# Patient Record
Sex: Female | Born: 1985 | Race: White | Hispanic: No | Marital: Single | Smoking: Never smoker
Health system: Southern US, Community
[De-identification: ages and names within clinical notes are randomized; demographics above are authoritative.]

## PROBLEM LIST (undated history)

## (undated) DIAGNOSIS — L309 Dermatitis, unspecified: Secondary | ICD-10-CM

## (undated) DIAGNOSIS — B999 Unspecified infectious disease: Secondary | ICD-10-CM

## (undated) DIAGNOSIS — O24419 Gestational diabetes mellitus in pregnancy, unspecified control: Secondary | ICD-10-CM

## (undated) DIAGNOSIS — N390 Urinary tract infection, site not specified: Secondary | ICD-10-CM

## (undated) DIAGNOSIS — N200 Calculus of kidney: Secondary | ICD-10-CM

## (undated) HISTORY — PX: INDUCED ABORTION: SHX677

---
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
## 2006-11-27 ENCOUNTER — Other Ambulatory Visit: Admission: RE | Admit: 2006-11-27 | Discharge: 2006-11-27 | Payer: Self-pay | Admitting: Family Medicine

## 2010-03-27 ENCOUNTER — Inpatient Hospital Stay (HOSPITAL_COMMUNITY): Admission: AD | Admit: 2010-03-27 | Discharge: 2010-03-28 | Payer: Self-pay | Admitting: Obstetrics & Gynecology

## 2010-10-11 ENCOUNTER — Emergency Department (HOSPITAL_COMMUNITY): Admission: EM | Admit: 2010-10-11 | Discharge: 2010-05-26 | Payer: Self-pay | Admitting: Emergency Medicine

## 2010-11-07 ENCOUNTER — Inpatient Hospital Stay (HOSPITAL_COMMUNITY)
Admission: AD | Admit: 2010-11-07 | Discharge: 2010-11-10 | Payer: Self-pay | Source: Home / Self Care | Attending: Obstetrics and Gynecology | Admitting: Obstetrics and Gynecology

## 2010-11-07 ENCOUNTER — Encounter (INDEPENDENT_AMBULATORY_CARE_PROVIDER_SITE_OTHER): Payer: Self-pay | Admitting: Obstetrics and Gynecology

## 2010-11-07 LAB — CBC
HCT: 31.4 % — ABNORMAL LOW (ref 36.0–46.0)
Hemoglobin: 11 g/dL — ABNORMAL LOW (ref 12.0–15.0)
MCH: 29.4 pg (ref 26.0–34.0)
MCHC: 35 g/dL (ref 30.0–36.0)
MCV: 84 fL (ref 78.0–100.0)
Platelets: 214 10*3/uL (ref 150–400)
RBC: 3.74 MIL/uL — ABNORMAL LOW (ref 3.87–5.11)
RDW: 13.4 % (ref 11.5–15.5)
WBC: 9.2 10*3/uL (ref 4.0–10.5)

## 2010-11-07 LAB — RPR: RPR Ser Ql: NONREACTIVE

## 2010-11-08 LAB — CBC
HCT: 29 % — ABNORMAL LOW (ref 36.0–46.0)
HCT: 27.8 % — ABNORMAL LOW (ref 36.0–46.0)
Hemoglobin: 10.1 g/dL — ABNORMAL LOW (ref 12.0–15.0)
Hemoglobin: 9.6 g/dL — ABNORMAL LOW (ref 12.0–15.0)
MCH: 29.3 pg (ref 26.0–34.0)
MCH: 29.3 pg (ref 26.0–34.0)
MCHC: 34.8 g/dL (ref 30.0–36.0)
MCHC: 34.5 g/dL (ref 30.0–36.0)
MCV: 84.1 fL (ref 78.0–100.0)
MCV: 84.8 fL (ref 78.0–100.0)
Platelets: 172 10*3/uL (ref 150–400)
Platelets: 193 10*3/uL (ref 150–400)
RBC: 3.45 MIL/uL — ABNORMAL LOW (ref 3.87–5.11)
RBC: 3.28 MIL/uL — ABNORMAL LOW (ref 3.87–5.11)
RDW: 13.4 % (ref 11.5–15.5)
RDW: 13.4 % (ref 11.5–15.5)
WBC: 12.7 10*3/uL — ABNORMAL HIGH (ref 4.0–10.5)
WBC: 13.6 10*3/uL — ABNORMAL HIGH (ref 4.0–10.5)

## 2010-11-12 ENCOUNTER — Inpatient Hospital Stay (HOSPITAL_COMMUNITY)
Admission: AD | Admit: 2010-11-12 | Discharge: 2010-11-13 | Payer: Self-pay | Source: Home / Self Care | Attending: Obstetrics and Gynecology | Admitting: Obstetrics and Gynecology

## 2010-11-19 LAB — DIFFERENTIAL
Basophils Absolute: 0 10*3/uL (ref 0.0–0.1)
Basophils Relative: 0 % (ref 0–1)
Eosinophils Absolute: 0.1 10*3/uL (ref 0.0–0.7)
Eosinophils Relative: 1 % (ref 0–5)
Lymphocytes Relative: 16 % (ref 12–46)
Lymphs Abs: 1.5 10*3/uL (ref 0.7–4.0)
Monocytes Absolute: 0.8 10*3/uL (ref 0.1–1.0)
Monocytes Relative: 8 % (ref 3–12)
Neutro Abs: 7.4 10*3/uL (ref 1.7–7.7)
Neutrophils Relative %: 75 % (ref 43–77)

## 2010-11-19 LAB — CBC
HCT: 27.5 % — ABNORMAL LOW (ref 36.0–46.0)
Hemoglobin: 9.4 g/dL — ABNORMAL LOW (ref 12.0–15.0)
MCH: 29.1 pg (ref 26.0–34.0)
MCHC: 34.2 g/dL (ref 30.0–36.0)
MCV: 85.1 fL (ref 78.0–100.0)
Platelets: 263 10*3/uL (ref 150–400)
RBC: 3.23 MIL/uL — ABNORMAL LOW (ref 3.87–5.11)
RDW: 12.9 % (ref 11.5–15.5)
WBC: 9.9 10*3/uL (ref 4.0–10.5)

## 2010-11-19 LAB — URINALYSIS, ROUTINE W REFLEX MICROSCOPIC
Bilirubin Urine: NEGATIVE
Ketones, ur: NEGATIVE mg/dL
Nitrite: NEGATIVE
Protein, ur: NEGATIVE mg/dL
Specific Gravity, Urine: 1.01 (ref 1.005–1.030)
Urine Glucose, Fasting: NEGATIVE mg/dL
Urobilinogen, UA: 2 mg/dL — ABNORMAL HIGH (ref 0.0–1.0)
pH: 7 (ref 5.0–8.0)

## 2010-11-19 LAB — COMPREHENSIVE METABOLIC PANEL
ALT: 16 U/L (ref 0–35)
AST: 15 U/L (ref 0–37)
Albumin: 2.5 g/dL — ABNORMAL LOW (ref 3.5–5.2)
Alkaline Phosphatase: 117 U/L (ref 39–117)
BUN: 6 mg/dL (ref 6–23)
CO2: 27 mEq/L (ref 19–32)
Calcium: 8.2 mg/dL — ABNORMAL LOW (ref 8.4–10.5)
Chloride: 108 mEq/L (ref 96–112)
Creatinine, Ser: 0.63 mg/dL (ref 0.4–1.2)
GFR calc Af Amer: >60 mL/min (ref >60)
GFR calc non Af Amer: >60 mL/min (ref >60)
Glucose, Bld: 80 mg/dL (ref 70–99)
Potassium: 3.1 mEq/L — ABNORMAL LOW (ref 3.5–5.1)
Sodium: 140 mEq/L (ref 135–145)
Total Bilirubin: 0.7 mg/dL (ref 0.3–1.2)
Total Protein: 5.3 g/dL — ABNORMAL LOW (ref 6.0–8.3)

## 2010-11-19 LAB — URINE MICROSCOPIC-ADD ON

## 2010-11-22 ENCOUNTER — Ambulatory Visit: Admit: 2010-11-22 | Payer: Self-pay | Admitting: Pulmonary Disease

## 2010-11-26 NOTE — Op Note (Signed)
Theresa Shepherd, Theresa Shepherd                ACCOUNT NO.:  [id number]  MEDICAL RECORD NO.:  [id number]          PATIENT TYPE:  INP  LOCATION:  9144                          FACILITY:  [facility]  PHYSICIAN:  Naima A. Dillard, M.D. DATE OF BIRTH:  [birth year]  DATE OF PROCEDURE: DATE OF DISCHARGE:                              OPERATIVE REPORT   PREOPERATIVE DIAGNOSIS:  Term, failure to progress, and nonreassuring fetal heart tones.  POSTOPERATIVE DIAGNOSIS:  Term, failure to progress, and nonreassuring fetal heart tones.  PROCEDURE:  Primary low transverse cesarean section.  SURGEON:  Naima A. Normand Sloop, M.D.  ASSISTANT:  Renaldo Reel. Emilee Hero, C.N.M.  ANESTHESIA:  Epidural.  FINDINGS:  Female infant with the body cord in vertex presentation, born at 11:35 p.m. with Apgars of 5 and 9.  Both arterial and venous gases were greater than 7.2.  Placenta was sent to Pathology.  ESTIMATED BLOOD LOSS:  800 mL.  IV FLUIDS:  3000 mL.  Urine was clear at the end of procedure.  There were no complications.  The patient to the PACU in stable condition.  PROCEDURE IN DETAIL:  The patient was taken to the operating room where epidural anesthesia was found to be adequate.  She was placed in dorsal supine position with a left lateral tilt.  A Pfannenstiel skin incision was made with a scalpel and carried down to the fascia.  The fascia was incised in the midline, extended bilaterally using Mayo scissors. Kochers x2 placed in the superior aspect of the fascia which was dissected off the rectus muscle in a blunt fashion and with scissors. The inferior aspect of the fascia was dissected in a similar fashion. The muscles were separated in the midline.  Peritoneum was identified, tented up, and entered sharply.  Bladder blade was reinserted.  The vesicouterine peritoneum was identified, tented up, and then sharply extended bilaterally.  The bladder blade was reinserted.  A primary low transverse uterine incision was  made with the scalpel and extended transversely bluntly.  Infant's head was delivered without difficulty, noted to have body cord with clear fluid.  The body was delivered without difficulty.  Cord clamped and cut.  Infant was handed to the awaiting pediatricians.  Cord gases were obtained.  Placenta was manually delivered.  The uterus was cleared of all clot and debris. Uterine incision was repaired with 0 Vicryl in a running locked fashion. Second layer of 0 Vicryl was used to imbricate the uterus.  Irrigation was done.  Hemostasis was assured.  Both adnexa were normal.  The abdominal anatomy was normal.  Irrigation was done again.  Hemostasis was assured.  Peritoneum was closed using 2-0 chromic.  The fascia was closed using 0 Vicryl in a running fashion.  Subcutaneous tissue was irrigated.  The skin was closed using 3-0 Monocryl in a subcuticular fashion.  Sponge, lap, and needle counts were correct.  The patient went to the recovery room in stable condition.     Naima A. Normand Sloop, M.D.     NAD/MEDQ  D:  11/08/2010  T:  11/08/2010  Job:  [id number]  Electronically Signed by Jaymes Graff M.D.  on 11/26/2010 03:00:50 PM

## 2010-12-17 NOTE — Discharge Summary (Signed)
  Theresa Shepherd, Theresa Shepherd                ACCOUNT NO.:  [id number]  MEDICAL RECORD NO.:  [id number]          PATIENT TYPE:  INP  LOCATION:  9144                          FACILITY:  [facility]  PHYSICIAN:  Osborn Coho, M.D.   DATE OF BIRTH:  [birth year]  DATE OF ADMISSION:  11/07/2010 DATE OF DISCHARGE:  11/10/2010                              DISCHARGE SUMMARY   ADMITTING DIAGNOSES: 1. Term intrauterine pregnancy. 2. Premature rupture of membranes. 3. Fetal heart rate reactive. 4. Group B streptococcus positive. 5. Asthma. 6. Second trimester marijuana use.  DISCHARGE DIAGNOSIS:  Status post primary low transverse cesarean section.  HOSPITAL PROCEDURES:  The patient was admitted with PROM at term and given Pitocin augmentation.  As followed,  the patient was noted to not change her cervix and there were nonreassuring fetal heart tones. Preoperative diagnosis was term failure to progress and nonreassuring fetal heart tones. The patient was given epidural for pain management and for cesarean section.  HOSPITAL COURSE:  The patient then followed a normal postoperative course.    Findings were a viable female infant, delivered at 2335 on November 07, 2010, weight was 7 pounds and 0 ounces, Apgars were 5 and 9.  CONDITION ON DISCHARGE:  Stable.  DISCHARGE MEDICATIONS:  Prenatal vitamins, albuterol inhaler p.r.n., Motrin and Percocet p.r.n.  DISCHARGE INSTRUCTIONS:  Per [facility] handout.  DISCHARGE MEDICATIONS:  As listed.  DISCHARGE FOLLOWUP:  At [facility] in 6 weeks.  The patient was deemed to receive full benefit of her hospital stay and was discharged home in stable condition.    ______________________________ Sanda Klein, CNM   ______________________________ Osborn Coho, M.D.    SL/MEDQ  D:  12/05/2010  T:  12/06/2010  Job:  [id number]  Electronically Signed by Sanda Klein CNM on 12/10/2010 08:34:23 AM Electronically Signed by Osborn Coho M.D. on 12/17/2010 10:59:08  AM

## 2010-12-20 ENCOUNTER — Institutional Professional Consult (permissible substitution): Payer: Self-pay | Admitting: Pulmonary Disease

## 2010-12-21 ENCOUNTER — Telehealth: Payer: Self-pay | Admitting: Pulmonary Disease

## 2010-12-26 NOTE — Progress Notes (Signed)
Summary: nos appt  Phone Note Call from Patient   Caller: [email or web address]  Call For: Phu Record Summary of Call: ATC pt to rsc nos from 2/16 phone disconnected. Initial call taken by: Darletta Moll,  December 21, 2010 4:08 PM

## 2011-01-21 LAB — WET PREP, GENITAL
Trich, Wet Prep: NONE SEEN
Yeast Wet Prep HPF POC: NONE SEEN

## 2011-01-21 LAB — CBC
HCT: 35.9 % — ABNORMAL LOW (ref 36.0–46.0)
Hemoglobin: 12.7 g/dL (ref 12.0–15.0)
MCHC: 35.4 g/dL (ref 30.0–36.0)
MCV: 90.4 fL (ref 78.0–100.0)
Platelets: 199 10*3/uL (ref 150–400)
RBC: 3.97 MIL/uL (ref 3.87–5.11)
RDW: 12.6 % (ref 11.5–15.5)
WBC: 10.6 10*3/uL — ABNORMAL HIGH (ref 4.0–10.5)

## 2011-01-21 LAB — URINALYSIS, ROUTINE W REFLEX MICROSCOPIC
Bilirubin Urine: NEGATIVE
Glucose, UA: NEGATIVE mg/dL
Hgb urine dipstick: NEGATIVE
Ketones, ur: 15 mg/dL — AB
Nitrite: NEGATIVE
Protein, ur: NEGATIVE mg/dL
Specific Gravity, Urine: >1.03 — ABNORMAL HIGH (ref 1.005–1.030)
Urobilinogen, UA: 0.2 mg/dL (ref 0.0–1.0)
pH: 6 (ref 5.0–8.0)

## 2011-01-21 LAB — HCG, QUANTITATIVE, PREGNANCY: hCG, Beta Chain, Quant, S: 47781 m[IU]/mL — ABNORMAL HIGH (ref <5)

## 2011-01-21 LAB — GC/CHLAMYDIA PROBE AMP, GENITAL
Chlamydia, DNA Probe: NEGATIVE
GC Probe Amp, Genital: NEGATIVE

## 2011-01-21 LAB — ABO/RH: ABO/RH(D): O POS

## 2011-01-21 LAB — POCT PREGNANCY, URINE: Preg Test, Ur: POSITIVE

## 2011-03-09 ENCOUNTER — Inpatient Hospital Stay (INDEPENDENT_AMBULATORY_CARE_PROVIDER_SITE_OTHER)
Admission: RE | Admit: 2011-03-09 | Discharge: 2011-03-09 | Disposition: A | Discharge status: Home / Self Care | Payer: Medicaid Other | Source: Ambulatory Visit | Attending: Family Medicine | Admitting: Family Medicine

## 2011-03-09 DIAGNOSIS — R300 Dysuria: Secondary | ICD-10-CM

## 2011-03-09 LAB — POCT URINALYSIS DIP (DEVICE)
Glucose, UA: 500 mg/dL — AB
Hgb urine dipstick: NEGATIVE
Ketones, ur: 15 mg/dL — AB
Nitrite: POSITIVE — AB
Protein, ur: 100 mg/dL — AB
Specific Gravity, Urine: 1.01 (ref 1.005–1.030)
Urobilinogen, UA: >=8 mg/dL (ref 0.0–1.0)
pH: 5 (ref 5.0–8.0)

## 2011-03-09 LAB — GLUCOSE, CAPILLARY: Glucose-Capillary: 95 mg/dL (ref 70–99)

## 2011-03-09 LAB — POCT PREGNANCY, URINE: Preg Test, Ur: NEGATIVE

## 2011-03-11 LAB — URINE CULTURE
Colony Count: 30000
Culture  Setup Time: 201205052007

## 2011-07-19 ENCOUNTER — Inpatient Hospital Stay (INDEPENDENT_AMBULATORY_CARE_PROVIDER_SITE_OTHER)
Admission: RE | Admit: 2011-07-19 | Discharge: 2011-07-19 | Disposition: A | Discharge status: Home / Self Care | Payer: Medicaid Other | Source: Ambulatory Visit | Attending: Family Medicine | Admitting: Family Medicine

## 2011-07-19 DIAGNOSIS — N390 Urinary tract infection, site not specified: Secondary | ICD-10-CM

## 2011-07-19 LAB — POCT URINALYSIS DIP (DEVICE)
Bilirubin Urine: NEGATIVE
Glucose, UA: NEGATIVE mg/dL
Ketones, ur: NEGATIVE mg/dL
Nitrite: POSITIVE — AB
Protein, ur: 30 mg/dL — AB
Specific Gravity, Urine: 1.025 (ref 1.005–1.030)
Urobilinogen, UA: 2 mg/dL — ABNORMAL HIGH (ref 0.0–1.0)
pH: 7 (ref 5.0–8.0)

## 2011-07-19 LAB — POCT PREGNANCY, URINE: Preg Test, Ur: NEGATIVE

## 2011-12-04 ENCOUNTER — Emergency Department (HOSPITAL_COMMUNITY)
Admission: EM | Admit: 2011-12-04 | Discharge: 2011-12-04 | Disposition: A | Discharge status: Home / Self Care | Payer: Medicaid Other | Source: Home / Self Care | Attending: Family Medicine | Admitting: Family Medicine

## 2011-12-04 ENCOUNTER — Encounter (HOSPITAL_COMMUNITY): Payer: Self-pay | Admitting: *Deleted

## 2011-12-04 DIAGNOSIS — N390 Urinary tract infection, site not specified: Secondary | ICD-10-CM

## 2011-12-04 LAB — POCT URINALYSIS DIP (DEVICE)
Nitrite: POSITIVE — AB
Protein, ur: 100 mg/dL — AB
Urobilinogen, UA: 2 mg/dL — ABNORMAL HIGH (ref 0.0–1.0)

## 2011-12-04 MED ORDER — CEPHALEXIN 500 MG PO CAPS: 500.0000 mg | ORAL_CAPSULE | Freq: Four times a day (QID) | ORAL | Status: AC

## 2011-12-04 NOTE — ED Provider Notes (Signed)
History     CSN: [id number]  Arrival date & time 12/04/11  1646   First MD Initiated Contact with Patient 12/04/11 1705      Chief Complaint  Patient presents with  . Urinary Tract Infection    (Consider location/radiation/quality/duration/timing/severity/associated sxs/prior treatment) Patient is a 26 y.o. female presenting with urinary tract infection. The history is provided by the patient.  Urinary Tract Infection This is a recurrent problem. The current episode started more than 1 week ago (has used otc meds for this without relief, has h/o same). The problem has been gradually worsening. Pertinent negatives include no abdominal pain.    Past Medical History  Diagnosis Date  . Asthma     History reviewed. No pertinent past surgical history.  Family History  Problem Relation Age of Onset  . Asthma Other   . Hypertension Other   . Diabetes Other     History  Substance Use Topics  . Smoking status: Not on file  . Smokeless tobacco: Not on file  . Alcohol Use:     OB History    Grav Para Term Preterm Abortions TAB SAB Ect Mult Living                  Review of Systems  Constitutional: Negative.   Gastrointestinal: Negative.  Negative for abdominal pain.  Genitourinary: Positive for dysuria, urgency and frequency.    Allergies  Augmentin  Home Medications   Current Outpatient Rx  Name Route Sig Dispense Refill  . CEPHALEXIN 500 MG PO CAPS Oral Take 1 capsule (500 mg total) by mouth 4 (four) times daily. Take all of medicine and drink lots of fluids 20 capsule 0    BP 104/70  Pulse 67  Temp(Src) 98.1 F (36.7 C) (Oral)  Resp 16  SpO2 100%  Physical Exam  Nursing note and vitals reviewed. Constitutional: She appears well-developed and well-nourished.  Abdominal: Soft. Bowel sounds are normal. She exhibits no mass. There is no tenderness. There is no rebound and no guarding.  Skin: Skin is warm and dry.    ED Course  Procedures (including  critical care time)  Labs Reviewed  POCT URINALYSIS DIP (DEVICE) - Abnormal; Notable for the following:    Glucose, UA 500 (*)    Bilirubin Urine SMALL (*)    Ketones, ur TRACE (*)    Hgb urine dipstick TRACE (*)    Protein, ur 100 (*)    Urobilinogen, UA 2.0 (*)    Nitrite POSITIVE (*)    Leukocytes, UA LARGE (*) Biochemical Testing Only. Please order routine urinalysis from main lab if confirmatory testing is needed.   All other components within normal limits   No results found.   1. UTI (lower urinary tract infection)       MDM          Barkley Bruns, MD 12/04/11 1745

## 2011-12-04 NOTE — ED Notes (Signed)
Pt     Reports  Symptoms  Of  Urinary  Frequency     And  Scanty  Output  With  Some  Discomfort  As  Well    -  Pt  Has  Had  The  Symptoms  For  A  While  She tried  otc  meds  That  Did not  Clear  It  Up

## 2012-01-14 ENCOUNTER — Emergency Department (HOSPITAL_COMMUNITY)
Admission: EM | Admit: 2012-01-14 | Discharge: 2012-01-14 | Disposition: A | Discharge status: Home / Self Care | Payer: Self-pay | Attending: Emergency Medicine | Admitting: Emergency Medicine

## 2012-01-14 ENCOUNTER — Encounter (HOSPITAL_COMMUNITY): Payer: Self-pay | Admitting: *Deleted

## 2012-01-14 DIAGNOSIS — K0889 Other specified disorders of teeth and supporting structures: Secondary | ICD-10-CM

## 2012-01-14 DIAGNOSIS — K089 Disorder of teeth and supporting structures, unspecified: Secondary | ICD-10-CM | POA: Insufficient documentation

## 2012-01-14 DIAGNOSIS — N63 Unspecified lump in unspecified breast: Secondary | ICD-10-CM

## 2012-01-14 DIAGNOSIS — N644 Mastodynia: Secondary | ICD-10-CM | POA: Insufficient documentation

## 2012-01-14 DIAGNOSIS — N6310 Unspecified lump in the right breast, unspecified quadrant: Secondary | ICD-10-CM

## 2012-01-14 DIAGNOSIS — R350 Frequency of micturition: Secondary | ICD-10-CM | POA: Insufficient documentation

## 2012-01-14 LAB — URINALYSIS, ROUTINE W REFLEX MICROSCOPIC
Ketones, ur: NEGATIVE mg/dL
Leukocytes, UA: NEGATIVE
Nitrite: NEGATIVE
Protein, ur: NEGATIVE mg/dL

## 2012-01-14 MED ORDER — AMOXICILLIN 500 MG PO CAPS: 500.0000 mg | ORAL_CAPSULE | Freq: Three times a day (TID) | ORAL | Status: AC

## 2012-01-14 MED ORDER — OXYCODONE-ACETAMINOPHEN 5-325 MG PO TABS: 1.0000 | ORAL_TABLET | Freq: Four times a day (QID) | ORAL | Status: AC | PRN

## 2012-01-14 NOTE — ED Notes (Signed)
Called the breast center to see about having pt have a outpatient visit for evaluation of right breat lump.  They have the order and will call pt to schedule [place].  Will explain this to pt
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
## 2012-01-14 NOTE — ED Notes (Signed)
Patient reports she has had toothache on the left lower side for 3 weeks.  She is having to take ibuprofen more frequently.  She states she also has lumps in her breast that are tender and she is concerned that she may have uti.  Patient urinary frequency

## 2012-02-18 ENCOUNTER — Ambulatory Visit (INDEPENDENT_AMBULATORY_CARE_PROVIDER_SITE_OTHER): Payer: Self-pay | Admitting: *Deleted

## 2012-02-18 VITALS — BP 116/79 | HR 47 | Temp 97.1°F | Ht 63.0 in | Wt 152.6 lb

## 2012-02-18 DIAGNOSIS — N6310 Unspecified lump in the right breast, unspecified quadrant: Secondary | ICD-10-CM

## 2012-02-18 DIAGNOSIS — N898 Other specified noninflammatory disorders of vagina: Secondary | ICD-10-CM

## 2012-02-18 DIAGNOSIS — Z01419 Encounter for gynecological examination (general) (routine) without abnormal findings: Secondary | ICD-10-CM

## 2012-02-18 DIAGNOSIS — N63 Unspecified lump in unspecified breast: Secondary | ICD-10-CM

## 2012-02-18 DIAGNOSIS — B379 Candidiasis, unspecified: Secondary | ICD-10-CM

## 2012-02-18 MED ORDER — FLUCONAZOLE 150 MG PO TABS: 150.0000 mg | ORAL_TABLET | Freq: Once | ORAL | Status: AC

## 2012-02-18 MED ORDER — NYSTATIN-TRIAMCINOLONE 100000-0.1 UNIT/GM-% EX OINT: TOPICAL_OINTMENT | Freq: Two times a day (BID) | CUTANEOUS | Status: DC

## 2012-02-18 NOTE — Progress Notes (Signed)
Addended by: Lynnell Dike on: 02/18/2012 10:41 AM   Modules accepted: Orders

## 2012-02-18 NOTE — Progress Notes (Signed)
Lumps, nipple change in color, and tenderness in right breast.

## 2012-02-18 NOTE — Progress Notes (Signed)
Addended by: Priscille Heidelberg on: 02/18/2012 12:51 PM   Modules accepted: Level of Service

## 2012-02-18 NOTE — Progress Notes (Addendum)
Complaints of 2 lumps in right breast and cracked itchy bilateral breast nipples.  Pap Smear:    Pap smear completed today. Per patient last Pap smear was August 2011 at [facility] and was normal. Per patient no history of abnormal Pap smears. No Pap smear results in EPIC.  Physical exam: Breasts Breasts symmetrical. Skin irritated and red between breasts and on inner bilateral breasts. It is itchy to patient and looks like yeast. Bilateral nipples dry and cracked. No nipple retraction bilateral breasts. No nipple discharge bilateral breasts. No lymphadenopathy. No lumps palpated left breast. Palpated 2 lumps in right breast. First lump is at 3 o'clock about 3 cm from the nipple. The second lump is at 1 o'clock about 6 cm from the nipple. No complaints of pain or tenderness on palpation. Patient referred to the [facility] for right breast ultrasound. Appointment scheduled for Thursday, February 20, 2012 at 1020. Prescriptions e-scribed by Dr. Catalina Antigua for Diflucan and Nystatin for yeast.          Pelvic/Bimanual   Ext Genitalia No lesions, no swelling and no discharge observed on external genitalia.         Vagina Vagina pink and normal texture. No lesions and thick white cottage cheese appearing discharge observed in vagina. Wet prep completed.          Cervix Cervix is present. Cervix pink and of normal texture. Thick white cottage cheese appearing discharge observed on cervix.     Uterus Uterus is present and palpable. Uterus in normal position and normal size.        Adnexae Bilateral ovaries present and palpable. No tenderness on palpation.          Rectovaginal No rectal exam completed today since patient had no rectal complaints. No skin abnormalities observed on exam.

## 2012-02-18 NOTE — Patient Instructions (Signed)
Taught patient how to perform BSE and gave educational materials to take home.  Let her know [facility] will cover Pap smears every 3 years unless has a history of abnormal Pap smears. Patient is scheduled for right breast ultrasound at the [facility]. Appointment scheduled for Thursday, February 20, 2012 at 1020. Explained to patient the medications prescribed for the yeast infection and how to use. Told patient she needs to follow up after she finishes taking the medication prescribed. Gave patient information about [employer] for follow up. Let patient know will follow up with her within the next couple weeks with results. Patient verbalized understanding.

## 2012-02-19 LAB — WET PREP, GENITAL

## 2012-02-20 ENCOUNTER — Ambulatory Visit
Admission: RE | Admit: 2012-02-20 | Discharge: 2012-02-20 | Disposition: A | Discharge status: Home / Self Care | Payer: No Typology Code available for payment source | Source: Ambulatory Visit | Attending: Emergency Medicine | Admitting: Emergency Medicine

## 2012-02-20 DIAGNOSIS — N6310 Unspecified lump in the right breast, unspecified quadrant: Secondary | ICD-10-CM

## 2012-02-24 ENCOUNTER — Telehealth: Payer: Self-pay | Admitting: *Deleted

## 2012-02-24 MED ORDER — METRONIDAZOLE 500 MG PO TABS: 500.0000 mg | ORAL_TABLET | Freq: Two times a day (BID) | ORAL | Status: AC

## 2012-02-24 NOTE — Progress Notes (Signed)
Addended by: Catalina Antigua on: 02/24/2012 09:17 AM   Modules accepted: Orders

## 2012-02-24 NOTE — Telephone Encounter (Signed)
Called patient and gave her results to Pap smear. Let her know that her Pap smear was normal. Gave patient results to wet prep and let her know it showed BV. Told her a prescription for Flagyl has been sent to her pharmacy for pick up. Informed her to avoid alcohol while taking prescription. Patient stated the Diflucan and Nystatin cleared up the yeast that she had on her chest. Let patient know that her breast ultrasound was negative and that she will need a screening mammogram at age 37. Encouraged her to do monthly BSE's and if notices any changes to call. Patient verbalized understanding.

## 2012-10-18 ENCOUNTER — Emergency Department (HOSPITAL_COMMUNITY): Payer: Self-pay

## 2012-10-18 ENCOUNTER — Emergency Department (HOSPITAL_COMMUNITY)
Admission: EM | Admit: 2012-10-18 | Discharge: 2012-10-18 | Disposition: A | Discharge status: Home / Self Care | Payer: Self-pay | Attending: Emergency Medicine | Admitting: Emergency Medicine

## 2012-10-18 ENCOUNTER — Encounter (HOSPITAL_COMMUNITY): Payer: Self-pay | Admitting: *Deleted

## 2012-10-18 DIAGNOSIS — R109 Unspecified abdominal pain: Secondary | ICD-10-CM | POA: Insufficient documentation

## 2012-10-18 DIAGNOSIS — X500XXA Overexertion from strenuous movement or load, initial encounter: Secondary | ICD-10-CM | POA: Insufficient documentation

## 2012-10-18 DIAGNOSIS — R350 Frequency of micturition: Secondary | ICD-10-CM | POA: Insufficient documentation

## 2012-10-18 DIAGNOSIS — N898 Other specified noninflammatory disorders of vagina: Secondary | ICD-10-CM | POA: Insufficient documentation

## 2012-10-18 DIAGNOSIS — Y929 Unspecified place or not applicable: Secondary | ICD-10-CM | POA: Insufficient documentation

## 2012-10-18 DIAGNOSIS — Z3202 Encounter for pregnancy test, result negative: Secondary | ICD-10-CM | POA: Insufficient documentation

## 2012-10-18 DIAGNOSIS — Y9389 Activity, other specified: Secondary | ICD-10-CM | POA: Insufficient documentation

## 2012-10-18 DIAGNOSIS — S93409A Sprain of unspecified ligament of unspecified ankle, initial encounter: Secondary | ICD-10-CM | POA: Insufficient documentation

## 2012-10-18 DIAGNOSIS — J45909 Unspecified asthma, uncomplicated: Secondary | ICD-10-CM | POA: Insufficient documentation

## 2012-10-18 LAB — POCT PREGNANCY, URINE: Preg Test, Ur: NEGATIVE

## 2012-10-18 LAB — URINALYSIS, ROUTINE W REFLEX MICROSCOPIC
Glucose, UA: NEGATIVE mg/dL
Leukocytes, UA: NEGATIVE
Specific Gravity, Urine: 1.021 (ref 1.005–1.030)
Urobilinogen, UA: 1 mg/dL (ref 0.0–1.0)

## 2012-10-18 LAB — URINE MICROSCOPIC-ADD ON

## 2012-10-18 MED ORDER — IBUPROFEN 400 MG PO TABS: 600.0000 mg | ORAL_TABLET | Freq: Once | ORAL | Status: DC

## 2012-10-18 NOTE — Progress Notes (Signed)
Orthopedic Tech Progress Note Patient Details:  Theresa Shepherd 1985-12-08 [id number]  Ortho Devices Type of Ortho Device: ASO Ortho Device/Splint Location: left LE Ortho Device/Splint Interventions: Application   Dontavius Keim T 10/18/2012, 6:01 PM

## 2012-10-18 NOTE — ED Notes (Signed)
Pt reports falling down steps on Friday and still having left ankle pain. Also wants to be checked for UTI due to urinary frequency.

## 2012-10-18 NOTE — ED Provider Notes (Signed)
History   This chart was scribed for Theresa Roots, MD by Melba Coon, ED Scribe. The patient was seen in room TR05C/TR05C and the patient's care was started at 5:12PM.   CSN: [id number]  Arrival date & time 10/18/12  1646   None     Chief Complaint  Patient presents with  . Ankle Pain    (Consider location/radiation/quality/duration/timing/severity/associated sxs/prior treatment) The history is provided by the patient. No language interpreter was used.   Theresa Shepherd is a 26 y.o. female who presents to the Emergency Department complaining of constant, mild to moderate left ankle pain pertaining to a fall with no head contact or LOC with an onset 2 days ago. She reports that she fell down the stairs and twisted her ankle while trying to avoid falling into someone else. She was ambulatory after the fall, but ambulation is decreased compared to baseline. Walking and bending her ankle aggravates the pain. Reports mild right flank pain. Denies HA, fever, neck pain, sore throat, rash, back pain, CP, SOB, abdominal pain, nausea, emesis, diarrhea, or extremity weakness, numbness, or tingling. She also reports she wants to be checked for a UTI since she reports urinary frequency; she has a Hx of UTI's in the past. No other pertinent medical symptoms. No dysuria. No abd or flank pain. No fever or chills. ?on period, mild spotting.    Past Medical History  Diagnosis Date  . Asthma     Past Surgical History  Procedure Date  . Cesarean section     Family History  Problem Relation Age of Onset  . Asthma Other   . Hypertension Other   . Diabetes Other   . Heart disease Paternal Grandfather   . Diabetes Maternal Grandmother   . Heart disease Maternal Grandmother   . Diabetes Mother   . Hypertension Mother     History  Substance Use Topics  . Smoking status: Never Smoker   . Smokeless tobacco: Not on file  . Alcohol Use: Yes     Comment: socially    OB History    Grav Para  Term Preterm Abortions TAB SAB Ect Mult Living   1 1 1       1       Review of Systems 10 Systems reviewed and all are negative for acute change except as noted in the HPI.   Allergies  Amoxicillin-pot clavulanate  Home Medications   No current outpatient prescriptions on file.  BP 121/56  Pulse 69  Temp 97.6 F (36.4 C) (Oral)  Resp 18  SpO2 100%  Physical Exam  Nursing note and vitals reviewed. Constitutional: She is oriented to person, place, and time. She appears well-developed and well-nourished. No distress.       Awake, alert, nontoxic appearance with baseline speech for patient.  HENT:  Head: Atraumatic.  Mouth/Throat: No oropharyngeal exudate.  Eyes: EOM are normal. Pupils are equal, round, and reactive to light. Right eye exhibits no discharge. Left eye exhibits no discharge.  Neck: Neck supple.  Cardiovascular: Normal rate and regular rhythm.   No murmur heard. Pulmonary/Chest: Effort normal and breath sounds normal. No stridor. No respiratory distress. She has no wheezes. She has no rales. She exhibits no tenderness.  Abdominal: Soft. Bowel sounds are normal. She exhibits no mass. There is no tenderness. There is no rebound and no guarding.  Genitourinary:       No cva tenderness  Musculoskeletal: She exhibits tenderness. She exhibits no edema.  Baseline ROM, moves extremities with no obvious new focal weakness. Tenderness lat malleolus left ankle. Ankle stable. Distal pulses palp. CTLS spine, non tender, aligned, no step off.  Lymphadenopathy:    She has no cervical adenopathy.  Neurological: She is alert and oriented to person, place, and time. No cranial nerve deficit. Coordination normal.  Skin: No rash noted. She is not diaphoretic.  Psychiatric: She has a normal mood and affect.    ED Course  Procedures (including critical care time)  COORDINATION OF CARE:  5:16PM - ASO ankle wrap, left ankle XR and pain meds will be ordered for [name]  along with a UA.  5:48PM - recheck; lab results reviewed and shows present RBCs. She reports intermittent vaginal spotting but no heavy bleeding.  Results for orders placed during the hospital encounter of 10/18/12  URINALYSIS, ROUTINE W REFLEX MICROSCOPIC      Component Value Range   Color, Urine YELLOW  YELLOW   APPearance CLOUDY (*) CLEAR   Specific Gravity, Urine 1.021  1.005 - 1.030   pH 7.0  5.0 - 8.0   Glucose, UA NEGATIVE  NEGATIVE mg/dL   Hgb urine dipstick MODERATE (*) NEGATIVE   Bilirubin Urine NEGATIVE  NEGATIVE   Ketones, ur NEGATIVE  NEGATIVE mg/dL   Protein, ur NEGATIVE  NEGATIVE mg/dL   Urobilinogen, UA 1.0  0.0 - 1.0 mg/dL   Nitrite NEGATIVE  NEGATIVE   Leukocytes, UA NEGATIVE  NEGATIVE  POCT PREGNANCY, URINE      Component Value Range   Preg Test, Ur NEGATIVE  NEGATIVE  URINE MICROSCOPIC-ADD ON      Component Value Range   Squamous Epithelial / LPF FEW (*) RARE   WBC, UA 0-2  <3 WBC/hpf   RBC / HPF 21-50  <3 RBC/hpf     5:55PM - imaging results reviewed and are unremarkable. She is ready for d/c.   *RADIOLOGY REPORT*  Clinical Data: Lateral ankle pain secondary to a fall.  LEFT ANKLE COMPLETE - 3+ VIEW  Comparison: None.  Findings: There is no fracture, dislocation, or joint effusion. Slight dorsal spurring on the navicular.  IMPRESSION: No acute abnormality.   Original Report Authenticated By: Francene Boyers, M.D.      MDM  I personally performed the services described in this documentation, which was scribed in my presence. The recorded information has been reviewed and is accurate.   aso brace.  Pt w no cva tenderness. States only urine symptom was mild frequency and that has improved. No abd or flank pain. No fever or chills. abd soft nt. States is having mild spotting/vaginal bleeding, no vaginal discharge.   Pt stable for d/c.     Theresa Roots, MD 10/18/12 [phone]

## 2013-03-03 ENCOUNTER — Encounter (HOSPITAL_COMMUNITY): Payer: Self-pay | Admitting: *Deleted

## 2013-03-03 ENCOUNTER — Emergency Department (HOSPITAL_COMMUNITY): Payer: Self-pay

## 2013-03-03 ENCOUNTER — Emergency Department (HOSPITAL_COMMUNITY)
Admission: EM | Admit: 2013-03-03 | Discharge: 2013-03-03 | Disposition: A | Discharge status: Home / Self Care | Payer: Self-pay | Attending: Emergency Medicine | Admitting: Emergency Medicine

## 2013-03-03 DIAGNOSIS — Y9389 Activity, other specified: Secondary | ICD-10-CM | POA: Insufficient documentation

## 2013-03-03 DIAGNOSIS — X500XXA Overexertion from strenuous movement or load, initial encounter: Secondary | ICD-10-CM | POA: Insufficient documentation

## 2013-03-03 DIAGNOSIS — Y92009 Unspecified place in unspecified non-institutional (private) residence as the place of occurrence of the external cause: Secondary | ICD-10-CM | POA: Insufficient documentation

## 2013-03-03 DIAGNOSIS — S93402A Sprain of unspecified ligament of left ankle, initial encounter: Secondary | ICD-10-CM

## 2013-03-03 DIAGNOSIS — J45909 Unspecified asthma, uncomplicated: Secondary | ICD-10-CM | POA: Insufficient documentation

## 2013-03-03 DIAGNOSIS — S93409A Sprain of unspecified ligament of unspecified ankle, initial encounter: Secondary | ICD-10-CM | POA: Insufficient documentation

## 2013-03-03 MED ORDER — IBUPROFEN 800 MG PO TABS: 800.0000 mg | ORAL_TABLET | Freq: Three times a day (TID) | ORAL | Status: DC

## 2013-03-03 MED ORDER — OXYCODONE-ACETAMINOPHEN 5-325 MG PO TABS
2.0000 | ORAL_TABLET | Freq: Once | ORAL | Status: AC
  Administered 2013-03-03: 2 via ORAL
  Filled 2013-03-03: qty 2

## 2013-03-03 MED ORDER — HYDROCODONE-ACETAMINOPHEN 5-325 MG PO TABS: 1.0000 | ORAL_TABLET | Freq: Four times a day (QID) | ORAL | Status: DC | PRN

## 2013-03-03 MED ORDER — IBUPROFEN 400 MG PO TABS
800.0000 mg | ORAL_TABLET | Freq: Once | ORAL | Status: AC
  Administered 2013-03-03: 800 mg via ORAL
  Filled 2013-03-03: qty 2

## 2013-03-03 MED ORDER — ONDANSETRON 4 MG PO TBDP
8.0000 mg | ORAL_TABLET | Freq: Once | ORAL | Status: AC
  Administered 2013-03-03: 8 mg via ORAL
  Filled 2013-03-03: qty 2

## 2013-03-03 NOTE — ED Provider Notes (Signed)
History     CSN: [id number]  Arrival date & time 03/03/13  1206   First MD Initiated Contact with Patient 03/03/13 1211      Chief Complaint  Patient presents with  . Foot Pain    (Consider location/radiation/quality/duration/timing/severity/associated sxs/prior treatment) HPI Comments: Patient presents for left lateral foot pain x2 days. Patient believes symptoms began after she slipped at home on some [place] dusting spray. Patient describes the pain as an aching and intermittent sharp sensation that is nonradiating, worse with movement and palpation as well as weightbearing, and improved with rest. Patient admits to associated swelling. She denies fevers, redness, pallor, and numbness or tingling in her extremities. No hx of IVDU.  The history is provided by the patient. No language interpreter was used.    Past Medical History  Diagnosis Date  . Asthma     Past Surgical History  Procedure Laterality Date  . Cesarean section      Family History  Problem Relation Age of Onset  . Asthma Other   . Hypertension Other   . Diabetes Other   . Heart disease Paternal Grandfather   . Diabetes Maternal Grandmother   . Heart disease Maternal Grandmother   . Diabetes Mother   . Hypertension Mother     History  Substance Use Topics  . Smoking status: Never Smoker   . Smokeless tobacco: Not on file  . Alcohol Use: Yes     Comment: socially    OB History   Grav Para Term Preterm Abortions TAB SAB Ect Mult Living   1 1 1       1       Review of Systems  Constitutional: Negative for fever.  Musculoskeletal: Positive for myalgias, joint swelling and arthralgias.  Skin: Negative for color change and pallor.  Neurological: Negative for weakness and numbness.  All other systems reviewed and are negative.    Allergies  Amoxicillin-pot clavulanate  Home Medications   Current Outpatient Rx  Name  Route  Sig  Dispense  Refill  . ibuprofen (ADVIL,MOTRIN) 200 MG tablet  Oral   Take 600 mg by mouth 2 (two) times daily as needed for pain.         Marland Kitchen HYDROcodone-acetaminophen (NORCO/VICODIN) 5-325 MG per tablet   Oral   Take 1-2 tablets by mouth every 6 (six) hours as needed for pain.   10 tablet   0   . ibuprofen (ADVIL,MOTRIN) 800 MG tablet   Oral   Take 1 tablet (800 mg total) by mouth 3 (three) times daily.   21 tablet   0     BP 118/73  Pulse 55  Temp(Src) 97.3 F (36.3 C) (Oral)  Resp 18  SpO2 99%  Physical Exam  Nursing note and vitals reviewed. Constitutional: She is oriented to person, place, and time. She appears well-developed and well-nourished. No distress.  HENT:  Head: Normocephalic and atraumatic.  Eyes: Conjunctivae are normal. No scleral icterus.  Neck: Normal range of motion.  Cardiovascular: Normal rate, regular rhythm and intact distal pulses.   DP and PT pulses 2+ bilaterally. Capillary refill normal in bilateral lower extremities.  Pulmonary/Chest: Effort normal. No respiratory distress.  Musculoskeletal:       Left ankle: She exhibits decreased range of motion and swelling. She exhibits no ecchymosis, no deformity, no laceration and normal pulse. Tenderness. Lateral malleolus tenderness found. Achilles tendon normal.       Left foot: Normal.  TTP of L lateral malleolus and lateral  aspect of dorsal surface of L ankle. Mild swelling appreciated without ecchymosis. Decreased ROM 2/2 discomfort. Neurovascularly intact.  Neurological: She is alert and oriented to person, place, and time. She has normal reflexes.  Skin: Skin is warm and dry. No rash noted. She is not diaphoretic. No erythema. No pallor.  Psychiatric: She has a normal mood and affect. Her behavior is normal.    ED Course  Procedures (including critical care time)  Labs Reviewed - No data to display Dg Ankle Complete Left  03/03/2013  *RADIOLOGY REPORT*  Clinical Data: Injury with pain.  LEFT ANKLE COMPLETE - 3+ VIEW  Comparison: 10/18/2012  Findings:  There is no evidence for fracture, subluxation or dislocation.  No worrisome lytic or sclerotic osseous lesion.  IMPRESSION: Normal exam.   Original Report Authenticated By: Kennith Center, M.D.     1. Ankle sprain, left, initial encounter     MDM  Uncomplicated ankle sprain. Patient neurovascularly intact without evidence of fracture or dislocation on Xray. ASO ankle applied in ED and crutches as well as RICE instruction provided. Patient given ortho follow up if symptoms do not begin to improve in 3 days and indications for ED return discussed. Patient states comfort and understanding with this d/c plan with no unaddressed concerns.        Antony Madura, PA-C 03/04/13 1551

## 2013-03-03 NOTE — Progress Notes (Signed)
Orthopedic Tech Progress Note Patient Details:  Theresa Shepherd May 01, 1986 [id number]  Ortho Devices Type of Ortho Device: ASO;Crutches Ortho Device/Splint Location: left ankle Ortho Device/Splint Interventions: Application   Elizer Bostic 03/03/2013, 2:23 PM

## 2013-03-03 NOTE — ED Notes (Signed)
Pt reports noticing pain to left side of foot 2 days ago and now it is to entire foot and seems to be radiating up her left lower leg. No swelling or redness noted, denies injury to foot.

## 2013-03-03 NOTE — ED Notes (Signed)
ON assessment no swelling or bruising noted to the Lt foot or ankle. Pt denies any injury to LT foot or ankle.On request Pt moves all toes on Lt foot and skin warm to touch.

## 2013-03-04 NOTE — ED Provider Notes (Signed)
.  [facility] Medical screening examination/treatment/procedure(s) were performed by non-physician practitioner and as supervising physician I was immediately available for consultation/collaboration.    Benny Lennert, MD 03/04/13 [phone]

## 2013-04-11 ENCOUNTER — Emergency Department (HOSPITAL_COMMUNITY)
Admission: EM | Admit: 2013-04-11 | Discharge: 2013-04-12 | Disposition: A | Discharge status: Home / Self Care | Payer: Medicaid Other | Attending: Emergency Medicine | Admitting: Emergency Medicine

## 2013-04-11 ENCOUNTER — Encounter (HOSPITAL_COMMUNITY): Payer: Self-pay | Admitting: *Deleted

## 2013-04-11 DIAGNOSIS — M545 Low back pain, unspecified: Secondary | ICD-10-CM | POA: Insufficient documentation

## 2013-04-11 DIAGNOSIS — J45909 Unspecified asthma, uncomplicated: Secondary | ICD-10-CM | POA: Insufficient documentation

## 2013-04-11 DIAGNOSIS — B372 Candidiasis of skin and nail: Secondary | ICD-10-CM

## 2013-04-11 DIAGNOSIS — B3789 Other sites of candidiasis: Secondary | ICD-10-CM | POA: Insufficient documentation

## 2013-04-11 DIAGNOSIS — Z3202 Encounter for pregnancy test, result negative: Secondary | ICD-10-CM | POA: Insufficient documentation

## 2013-04-11 DIAGNOSIS — K5900 Constipation, unspecified: Secondary | ICD-10-CM

## 2013-04-11 DIAGNOSIS — Z87440 Personal history of urinary (tract) infections: Secondary | ICD-10-CM | POA: Insufficient documentation

## 2013-04-11 HISTORY — DX: Urinary tract infection, site not specified: N39.0

## 2013-04-11 LAB — URINE MICROSCOPIC-ADD ON

## 2013-04-11 LAB — URINALYSIS, ROUTINE W REFLEX MICROSCOPIC
Bilirubin Urine: NEGATIVE
Nitrite: NEGATIVE
Protein, ur: NEGATIVE mg/dL
Specific Gravity, Urine: 1.027 (ref 1.005–1.030)
Urobilinogen, UA: 1 mg/dL (ref 0.0–1.0)

## 2013-04-11 NOTE — ED Notes (Signed)
Low back pain for about 2 weeks, better when lying flat, treated self for yeast, history of UTI's

## 2013-04-12 MED ORDER — NAPROXEN 500 MG PO TABS: 500.0000 mg | ORAL_TABLET | Freq: Two times a day (BID) | ORAL | Status: DC

## 2013-04-12 MED ORDER — NYSTATIN 100000 UNIT/GM EX CREA: TOPICAL_CREAM | Freq: Four times a day (QID) | CUTANEOUS | Status: DC

## 2013-04-12 MED ORDER — NYSTATIN 100000 UNIT/GM EX POWD: Freq: Four times a day (QID) | CUTANEOUS | Status: DC

## 2013-04-12 MED ORDER — POLYETHYLENE GLYCOL 3350 17 GM/SCOOP PO POWD: 17.0000 g | Freq: Two times a day (BID) | ORAL | Status: DC

## 2013-04-12 MED ORDER — METHOCARBAMOL 500 MG PO TABS: 500.0000 mg | ORAL_TABLET | Freq: Two times a day (BID) | ORAL | Status: DC

## 2013-04-12 NOTE — ED Provider Notes (Signed)
History    This chart was scribed for a non-physician practitioner working with Gerhard Munch, MD by Jiles Prows, ED scribe. This patient was seen in room WTR6/WTR6 and the patient's care was started at 12:21 AM.  CSN: [id number]  Arrival date & time 04/11/13  2231  Chief Complaint  Patient presents with  . Back Pain    Patient is a 27 y.o. female presenting with back pain. The history is provided by the patient and medical records. No language interpreter was used.  Back Pain Location:  Lumbar spine Quality:  Cramping Radiates to:  Does not radiate Pain severity:  Moderate Pain is:  Worse during the day Onset quality:  Gradual Duration:  2 weeks Timing:  Intermittent Progression:  Unchanged Chronicity:  New Relieved by:  Lying down and ibuprofen Worsened by:  Ambulation and sitting Associated symptoms: no abdominal pain, no chest pain, no dysuria, no fever and no headaches   Risk factors: not obese    HPI Comments: Theresa Shepherd is a 27 y.o. female with a h/o asthma who presents to the Emergency Department complaining of intermittent, moderate lower back pain that began about 2 weeks ago. She claims today the pain is worse. She reports that when she sleeps flat, she feels tightness in her back but no pain.  She reports after sitting for an extended period, she has more pain in her lower back.  Pt reports today she felt a bit constipated (last bowel movement 2 days ago) and had pressure in her lower abdomen in the bladder area.  Pt reports that sometimes in the morning it burns when she urinates.  She reports a history of UTIs, denies that this feels like a urinary tract infection.  Pt denies numbness in feet, headache, diaphoresis, fever, chills, nausea, vomiting, diarrhea, weakness, cough, SOB and any other pain.  Pt also reports itchy breasts and nipples bilaterally, with persistent rash for several weeks.  He should states she often sweats while wearing her bra.  She has had candida  infection in her flexural folds before.    Past Medical History  Diagnosis Date  . Asthma   . UTI (urinary tract infection)     Past Surgical History  Procedure Laterality Date  . Cesarean section      Family History  Problem Relation Age of Onset  . Asthma Other   . Hypertension Other   . Diabetes Other   . Heart disease Paternal Grandfather   . Diabetes Maternal Grandmother   . Heart disease Maternal Grandmother   . Diabetes Mother   . Hypertension Mother     History  Substance Use Topics  . Smoking status: Never Smoker   . Smokeless tobacco: Not on file  . Alcohol Use: Yes     Comment: socially    OB History   Grav Para Term Preterm Abortions TAB SAB Ect Mult Living   1 1 1       1       Review of Systems  Constitutional: Negative for fever, diaphoresis, appetite change, fatigue and unexpected weight change.  HENT: Negative for mouth sores and neck stiffness.   Eyes: Negative for visual disturbance.  Respiratory: Negative for cough, chest tightness, shortness of breath and wheezing.   Cardiovascular: Negative for chest pain.  Gastrointestinal: Positive for constipation. Negative for nausea, vomiting, abdominal pain and diarrhea.  Endocrine: Negative for polydipsia, polyphagia and polyuria.  Genitourinary: Negative for dysuria, urgency, frequency and hematuria.  Musculoskeletal: Positive for back  pain (lower).  Skin: Positive for rash.  Allergic/Immunologic: Negative for immunocompromised state.  Neurological: Negative for syncope, light-headedness and headaches.  Hematological: Does not bruise/bleed easily.  Psychiatric/Behavioral: Negative for sleep disturbance. The patient is not nervous/anxious.     Allergies  Amoxicillin-pot clavulanate  Home Medications   Current Outpatient Rx  Name  Route  Sig  Dispense  Refill  . ibuprofen (ADVIL,MOTRIN) 200 MG tablet   Oral   Take 600 mg by mouth 2 (two) times daily as needed for pain.         .  methocarbamol (ROBAXIN) 500 MG tablet   Oral   Take 1 tablet (500 mg total) by mouth 2 (two) times daily.   20 tablet   0   . naproxen (NAPROSYN) 500 MG tablet   Oral   Take 1 tablet (500 mg total) by mouth 2 (two) times daily with a meal.   30 tablet   0   . nystatin (MYCOSTATIN) powder   Topical   Apply topically 4 (four) times daily.   15 g   0   . nystatin cream (MYCOSTATIN)   Topical   Apply topically 4 (four) times daily. Apply to affected area every 4-6 hours x 10 days   30 g   0   . polyethylene glycol powder (GLYCOLAX/MIRALAX) powder   Oral   Take 17 g by mouth 2 (two) times daily. Until daily soft stools  OTC   255 g   0     BP 114/62  Pulse 57  Temp(Src) 98.2 F (36.8 C) (Oral)  Resp 18  Wt 136 lb 12.8 oz (62.052 kg)  BMI 24.24 kg/m2  SpO2 98%  Physical Exam  Nursing note and vitals reviewed. Constitutional: She is oriented to person, place, and time. She appears well-developed and well-nourished. No distress.  HENT:  Head: Normocephalic and atraumatic.  Mouth/Throat: Oropharynx is clear and moist. No oropharyngeal exudate.  Eyes: Conjunctivae and EOM are normal. Pupils are equal, round, and reactive to light.  Neck: Normal range of motion. Neck supple.  Full ROM without pain  Cardiovascular: Normal rate, regular rhythm, normal heart sounds and intact distal pulses.   No murmur heard. Pulmonary/Chest: Effort normal and breath sounds normal. No respiratory distress. She has no wheezes. She has no rales.  Abdominal: Soft. Bowel sounds are normal. She exhibits no distension. There is no tenderness. There is no rebound.  Musculoskeletal: Normal range of motion. She exhibits no edema and no tenderness.       Cervical back: Normal. She exhibits normal range of motion, no tenderness, no bony tenderness, no swelling, no edema, no deformity and no laceration.       Thoracic back: Normal. She exhibits normal range of motion, no tenderness, no bony  tenderness, no swelling, no edema, no deformity, no laceration and no pain.       Lumbar back: She exhibits pain. She exhibits normal range of motion, no swelling, no edema, no deformity, no laceration and no spasm.       Back:  Tenderness to palpation over bilateral SI joints. No midline tenderness in T-spine or L-spine.  Lymphadenopathy:    She has no cervical adenopathy.  Neurological: She is alert and oriented to person, place, and time. She has normal reflexes. She exhibits normal muscle tone. Coordination normal.  Speech is clear and goal oriented, follows commands Normal strength in upper and lower extremities bilaterally including dorsiflexion and plantar flexion, strong and equal grip strength Sensation  normal to light and sharp touch Moves extremities without ataxia, coordination intact Normal gait Normal balance   Skin: Skin is warm and dry. No rash noted. She is not diaphoretic. No erythema.  Psychiatric: She has a normal mood and affect.    ED Course  Procedures (including critical care time) DIAGNOSTIC STUDIES: Oxygen Saturation is 98% on RA, normal by my interpretation.    COORDINATION OF CARE: 12:29 AM - Discussed ED treatment with pt at bedside and pt agrees.   Labs Reviewed  URINALYSIS, ROUTINE W REFLEX MICROSCOPIC - Abnormal; Notable for the following:    APPearance CLOUDY (*)    Leukocytes, UA SMALL (*)    All other components within normal limits  URINE MICROSCOPIC-ADD ON - Abnormal; Notable for the following:    Squamous Epithelial / LPF MANY (*)    Bacteria, UA FEW (*)    Crystals CA OXALATE CRYSTALS (*)    All other components within normal limits  URINE CULTURE  POCT PREGNANCY, URINE   No results found.  1. Low back pain   2. Constipation   3. Candida infection of flexural skin     MDM  [name] presents with multiple complaints tonight.  Patient with low back pain.  No neurological deficits and normal neuro exam.  Patient can walk but  states is painful.  No loss of bowel or bladder control.  No concern for cauda equina.  No fever, night sweats, weight loss, h/o cancer, IVDU.  RICE protocol and pain medicine indicated and discussed with patient.   Patient also presents with constipation, without abdominal pain. No concern for internal or external hemorrhoids; low likelihood of fecal impaction.  We'll discharge home with MiraLax.  Discussed at length a healthy bowel regimen.    Patient with bladder pressure but no dysuria or hematuria.  Test negative, urinalysis with contamination but 3-5 white blood cells and few bacteria. We'll send for culture. I recommended the patient that we not treat for infection at this time and she does not have any dysuria is with her previous UTIs. If her culture grows bacteria we will treated at that time.  Patient also with a rash in the flexural folds beneath her breast with erythema, raised borders and central clearing. Rash consistent with Candida will treat with nystatin powder and cream. Recommend followup with dermatology.  I have also discussed reasons to return immediately to the ER for all of these complaints.  Patient expresses understanding and agrees with plan.  I personally performed the services described in this documentation, which was scribed in my presence. The recorded information has been reviewed and is accurate.    Dahlia Client Hoa Briggs, PA-C 04/12/13 [phone]

## 2013-04-13 LAB — URINE CULTURE: Colony Count: 5000

## 2013-04-13 NOTE — ED Provider Notes (Signed)
  Medical screening examination/treatment/procedure(s) were performed by non-physician practitioner and as supervising physician I was immediately available for consultation/collaboration.    Fareed Fung, MD 04/13/13 0759 

## 2013-04-16 ENCOUNTER — Emergency Department (HOSPITAL_COMMUNITY): Payer: Medicaid Other

## 2013-04-16 ENCOUNTER — Emergency Department (HOSPITAL_COMMUNITY)
Admission: EM | Admit: 2013-04-16 | Discharge: 2013-04-16 | Disposition: A | Discharge status: Home / Self Care | Payer: Medicaid Other | Attending: Emergency Medicine | Admitting: Emergency Medicine

## 2013-04-16 ENCOUNTER — Encounter (HOSPITAL_COMMUNITY): Payer: Self-pay | Admitting: *Deleted

## 2013-04-16 DIAGNOSIS — M549 Dorsalgia, unspecified: Secondary | ICD-10-CM

## 2013-04-16 DIAGNOSIS — M545 Low back pain, unspecified: Secondary | ICD-10-CM | POA: Insufficient documentation

## 2013-04-16 DIAGNOSIS — Z880 Allergy status to penicillin: Secondary | ICD-10-CM | POA: Insufficient documentation

## 2013-04-16 DIAGNOSIS — J45909 Unspecified asthma, uncomplicated: Secondary | ICD-10-CM | POA: Insufficient documentation

## 2013-04-16 DIAGNOSIS — K5900 Constipation, unspecified: Secondary | ICD-10-CM | POA: Insufficient documentation

## 2013-04-16 DIAGNOSIS — Z3202 Encounter for pregnancy test, result negative: Secondary | ICD-10-CM | POA: Insufficient documentation

## 2013-04-16 DIAGNOSIS — Z87440 Personal history of urinary (tract) infections: Secondary | ICD-10-CM | POA: Insufficient documentation

## 2013-04-16 DIAGNOSIS — R109 Unspecified abdominal pain: Secondary | ICD-10-CM | POA: Insufficient documentation

## 2013-04-16 LAB — URINALYSIS, ROUTINE W REFLEX MICROSCOPIC
Bilirubin Urine: NEGATIVE
Glucose, UA: NEGATIVE mg/dL
Ketones, ur: 15 mg/dL — AB
Protein, ur: NEGATIVE mg/dL

## 2013-04-16 LAB — URINE MICROSCOPIC-ADD ON

## 2013-04-16 MED ORDER — ACETAMINOPHEN 500 MG PO TABS
1000.0000 mg | ORAL_TABLET | Freq: Once | ORAL | Status: AC
  Administered 2013-04-16: 1000 mg via ORAL
  Filled 2013-04-16: qty 2

## 2013-04-16 MED ORDER — OXYCODONE-ACETAMINOPHEN 5-325 MG PO TABS: 1.0000 | ORAL_TABLET | Freq: Four times a day (QID) | ORAL | Status: DC | PRN

## 2013-04-16 MED ORDER — PROMETHAZINE HCL 25 MG PO TABS: 25.0000 mg | ORAL_TABLET | Freq: Four times a day (QID) | ORAL | Status: DC | PRN

## 2013-04-16 MED ORDER — ONDANSETRON 4 MG PO TBDP
8.0000 mg | ORAL_TABLET | Freq: Once | ORAL | Status: DC
  Filled 2013-04-16: qty 2

## 2013-04-16 NOTE — ED Notes (Signed)
Patient returned from radiology

## 2013-04-16 NOTE — ED Provider Notes (Signed)
Medical screening examination/treatment/procedure(s) were performed by non-physician practitioner and as supervising physician I was immediately available for consultation/collaboration.   Kalif Kattner, MD 04/16/13 1707 

## 2013-04-16 NOTE — ED Provider Notes (Signed)
History     CSN: [id number]  Arrival date & time 04/16/13  0865   First MD Initiated Contact with Patient 04/16/13 [phone]      Chief Complaint  Patient presents with  . Back Pain    lower back pain    (Consider location/radiation/quality/duration/timing/severity/associated sxs/prior treatment) HPI Comments: Patient is a 27 year old female who presents today after being seen at [facility] long one week ago for 3 weeks of low back pressure. She was diagnosed with constipation and states she has been taking stool softeners and laxatives. She had one large bowel movement today and states she feels moderately improved. She can sometimes elicit the pain when she bends over. She does have full range of motion. She states sometimes it feels as though her leg goes numb. This is not constant. She also reports a suprapubic pressure. The pain is worse when she is sitting for long periods of time. She reports decrease in appetite. She has taken a muscle relaxer without improvement. 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 No bowel or bladder incontinence, IV drug abuse, history of cancer, fevers, chills.    Patient is a 27 y.o. female presenting with back pain. The history is provided by the patient. No language interpreter was used.  Back Pain Associated symptoms: no chest pain and no fever     Past Medical History  Diagnosis Date  . Asthma   . UTI (urinary tract infection)     Past Surgical History  Procedure Laterality Date  . Cesarean section      Family History  Problem Relation Age of Onset  . Asthma Other   . Hypertension Other   . Diabetes Other   . Heart disease Paternal Grandfather   . Diabetes Maternal Grandmother   . Heart disease Maternal Grandmother   . Diabetes Mother   . Hypertension Mother     History  Substance Use Topics  . Smoking status: Never Smoker   . Smokeless tobacco: Not on file  . Alcohol Use: Yes      Comment: socially    OB History   Grav Para Term Preterm Abortions TAB SAB Ect Mult Living   1 1 1       1       Review of Systems  Constitutional: Negative for fever and chills.  Respiratory: Negative for shortness of breath.   Cardiovascular: Negative for chest pain.  Gastrointestinal: Negative for nausea and vomiting.  Genitourinary: Negative for urgency and frequency.       Bladder pressure  Musculoskeletal: Positive for back pain. Negative for joint swelling and gait problem.  All other systems reviewed and are negative.    Allergies  Amoxicillin-pot clavulanate  Home Medications   Current Outpatient Rx  Name  Route  Sig  Dispense  Refill  . ibuprofen (ADVIL,MOTRIN) 200 MG tablet   Oral   Take 600 mg by mouth 2 (two) times daily as needed for pain.         . methocarbamol (ROBAXIN) 500 MG tablet   Oral   Take 1 tablet (500 mg total) by mouth 2 (two) times daily.   20 tablet   0   . naproxen (NAPROSYN) 500 MG tablet   Oral   Take 1 tablet (500 mg total) by mouth 2 (two) times daily with a meal.   30 tablet   0   . nystatin (MYCOSTATIN) powder   Topical  Apply topically 4 (four) times daily.   15 g   0   . nystatin cream (MYCOSTATIN)   Topical   Apply topically 4 (four) times daily. Apply to affected area every 4-6 hours x 10 days   30 g   0   . polyethylene glycol powder (GLYCOLAX/MIRALAX) powder   Oral   Take 17 g by mouth 2 (two) times daily. Until daily soft stools  OTC   255 g   0     BP 122/67  Pulse 71  Temp(Src) 98.3 F (36.8 C) (Oral)  Resp 18  Wt 136 lb (61.689 kg)  BMI 24.1 kg/m2  SpO2 100%  Physical Exam  Nursing note and vitals reviewed. Constitutional: She is oriented to person, place, and time. She appears well-developed and well-nourished.  Non-toxic appearance. She does not have a sickly appearance. She does not appear ill. No distress.  HENT:  Head: Normocephalic and atraumatic.  Right Ear: External ear  normal.  Left Ear: External ear normal.  Nose: Nose normal.  Mouth/Throat: Oropharynx is clear and moist.  Eyes: Conjunctivae are normal.  Neck: Normal range of motion.  Cardiovascular: Normal rate, regular rhythm and normal heart sounds.   Pulmonary/Chest: Effort normal and breath sounds normal. No stridor. No respiratory distress. She has no wheezes. She has no rales.  Abdominal: Soft. She exhibits no distension. There is no tenderness.  Musculoskeletal: Normal range of motion.       Cervical back: She exhibits normal range of motion, no tenderness, no bony tenderness and no deformity.       Thoracic back: She exhibits normal range of motion, no tenderness and no deformity.       Lumbar back: She exhibits normal range of motion, no tenderness, no bony tenderness and no deformity.  Difficult to reproduce tenderness, but inconsistently produced in SI joint bilaterally  Neurological: She is alert and oriented to person, place, and time. She has normal strength. No sensory deficit. She exhibits normal muscle tone. Coordination and gait normal.  Ambulates without difficulty   Skin: Skin is warm and dry. She is not diaphoretic. No erythema.  Psychiatric: She has a normal mood and affect. Her behavior is normal.    ED Course  Procedures (including critical care time)  Labs Reviewed  URINALYSIS, ROUTINE W REFLEX MICROSCOPIC - Abnormal; Notable for the following:    APPearance CLOUDY (*)    Hgb urine dipstick LARGE (*)    Ketones, ur 15 (*)    Leukocytes, UA TRACE (*)    All other components within normal limits  URINE MICROSCOPIC-ADD ON - Abnormal; Notable for the following:    Squamous Epithelial / LPF MANY (*)    Bacteria, UA FEW (*)    All other components within normal limits  URINE CULTURE  PREGNANCY, URINE   Dg Sacrum/coccyx  04/16/2013   *RADIOLOGY REPORT*  Clinical Data: Low back pain.  SACRUM AND COCCYX - 2+ VIEW  Comparison: No priors.  Findings: Four views of the sacrum and  coccyx demonstrate no acute displaced fracture.  Visualized portions of the bony pelvis are unremarkable.  IMPRESSION: 1.  No acute radiographic abnormality of the sacrum and coccyx.   Original Report Authenticated By: Trudie Reed, M.D.   Dg Abd Acute W/chest  04/16/2013   *RADIOLOGY REPORT*  Clinical Data: Back pain.  Constipation.  ACUTE ABDOMEN SERIES (ABDOMEN 2 VIEW & CHEST 1 VIEW)  Comparison: No priors.  Findings: Lung volumes are normal.  No consolidative airspace  disease.  No pleural effusions.  No pneumothorax.  No pulmonary nodule or mass noted.  Pulmonary vasculature and the cardiomediastinal silhouette are within normal limits.  Gas and stool are seen scattered throughout the colon extending to the level of the rectum.  No pathologic distension of small bowel is noted.  No gross evidence of pneumoperitoneum.  IMPRESSION: 1.  Nonobstructive bowel gas pattern. 2.  No pneumoperitoneum. 3.  No radiographic evidence of acute cardiopulmonary disease.   Original Report Authenticated By: Trudie Reed, M.D.     1. Back pain   2. Constipation       MDM  Patient with back pain.  No neurological deficits and normal neuro exam.  Patient can walk but states is painful.  No loss of bowel or bladder control.  No concern for cauda equina.  No fever, night sweats, weight loss, h/o cancer, IVDU.  RICE protocol and pain medicine indicated and discussed with patient.   It is reassuring that your back pain improved with a bowel movement. Discussed healthy bowel regimen. Return instructions given. Vital sign stable for discharge.   No cause for hematuria. No concomitant infection. Doubt this is a stone. Strongly encouraged to follow up with PCP. Resource guide given. Discussed case with Dr. Manus Gunning who agrees with plan.       Mora Bellman, PA-C 04/16/13 [phone]

## 2013-04-16 NOTE — ED Notes (Signed)
Patient c/o lower back pain x few days, patient states she has been seen for same at [facility] ED.  Patient also states she has been constipated and has taken a stool softners with some relief of pressure in lower back.

## 2013-04-18 LAB — URINE CULTURE: Colony Count: >=100000

## 2013-10-24 ENCOUNTER — Encounter (HOSPITAL_COMMUNITY): Payer: Self-pay | Admitting: Emergency Medicine

## 2013-10-24 ENCOUNTER — Emergency Department (HOSPITAL_COMMUNITY)
Admission: EM | Admit: 2013-10-24 | Discharge: 2013-10-24 | Disposition: A | Discharge status: Home / Self Care | Payer: Medicaid Other | Source: Home / Self Care | Attending: Family Medicine | Admitting: Family Medicine

## 2013-10-24 DIAGNOSIS — J069 Acute upper respiratory infection, unspecified: Secondary | ICD-10-CM

## 2013-10-24 MED ORDER — AZITHROMYCIN 250 MG PO TABS: 250.0000 mg | ORAL_TABLET | Freq: Every day | ORAL | Status: DC

## 2013-10-24 MED ORDER — IPRATROPIUM BROMIDE 0.06 % NA SOLN: 2.0000 | Freq: Four times a day (QID) | NASAL | Status: DC

## 2013-10-24 MED ORDER — HYDROCODONE-ACETAMINOPHEN 5-325 MG PO TABS: 0.5000 | ORAL_TABLET | Freq: Every evening | ORAL | Status: DC | PRN

## 2013-10-24 NOTE — ED Provider Notes (Signed)
Theresa Shepherd is a 27 y.o. female who presents to Urgent Care today for one week of worsening cough with a few episodes of posttussive emesis muscle aches and expiratory back pain as well as her irritation. Patient has tried Robitussin which seems to help. She's been exposed to a child of hers with a similar illness. No vomiting aside from a few episodes of posttussive emesis. No diarrhea. No trouble breathing. She feels well otherwise.   Past Medical History  Diagnosis Date  . Asthma   . UTI (urinary tract infection)    History  Substance Use Topics  . Smoking status: Never Smoker   . Smokeless tobacco: Not on file  . Alcohol Use: Yes     Comment: socially   ROS as above Medications reviewed. No current facility-administered medications for this encounter.   Current Outpatient Prescriptions  Medication Sig Dispense Refill  . azithromycin (ZITHROMAX) 250 MG tablet Take 1 tablet (250 mg total) by mouth daily. Take first 2 tablets together, then 1 every day until finished.  6 tablet  0  . HYDROcodone-acetaminophen (NORCO/VICODIN) 5-325 MG per tablet Take 0.5 tablets by mouth at bedtime as needed (cough).  6 tablet  0  . ipratropium (ATROVENT) 0.06 % nasal spray Place 2 sprays into both nostrils 4 (four) times daily.  15 mL  1  . [DISCONTINUED] promethazine (PHENERGAN) 25 MG tablet Take 1 tablet (25 mg total) by mouth every 6 (six) hours as needed for nausea.  12 tablet  0    Exam:  BP 127/64  Pulse 85  Temp(Src) 98.4 F (36.9 C) (Oral)  Resp 16  SpO2 97% Gen: Well NAD HEENT: EOMI,  MMM posterior pharynx with cobblestoning. Tympanic membranes are normal appearing bilaterally. Lungs: Normal work of breathing. CTABL Heart: RRR no MRG Abd: NABS, Soft. NT, ND Exts: Non edematous BL  LE, warm and well perfused.   No results found for this or any previous visit (from the past 24 hour(s)). No results found.  Assessment and Plan: 27 y.o. female with bronchitis. Plan for treatment  with azithromycin, Norco for cough, and Atrovent nasal spray. Discussed warning signs or symptoms. Please see discharge instructions. Patient expresses understanding.      Rodolph Bong, MD 10/24/13 1739

## 2013-10-24 NOTE — ED Notes (Signed)
Assessment per Dr. Corey. 

## 2014-04-04 ENCOUNTER — Other Ambulatory Visit: Payer: Self-pay | Admitting: Physician Assistant

## 2014-04-04 ENCOUNTER — Other Ambulatory Visit: Payer: Self-pay

## 2014-04-04 DIAGNOSIS — N644 Mastodynia: Secondary | ICD-10-CM

## 2014-04-08 ENCOUNTER — Ambulatory Visit
Admission: RE | Admit: 2014-04-08 | Discharge: 2014-04-08 | Disposition: A | Discharge status: Home / Self Care | Payer: Medicaid Other | Source: Ambulatory Visit | Attending: Physician Assistant | Admitting: Physician Assistant

## 2014-04-08 DIAGNOSIS — N644 Mastodynia: Secondary | ICD-10-CM

## 2014-09-05 ENCOUNTER — Encounter (HOSPITAL_COMMUNITY): Payer: Self-pay | Admitting: Emergency Medicine

## 2016-11-28 ENCOUNTER — Encounter (HOSPITAL_COMMUNITY): Payer: Self-pay | Admitting: Emergency Medicine

## 2016-11-28 DIAGNOSIS — J45909 Unspecified asthma, uncomplicated: Secondary | ICD-10-CM | POA: Insufficient documentation

## 2016-11-28 DIAGNOSIS — N946 Dysmenorrhea, unspecified: Secondary | ICD-10-CM | POA: Insufficient documentation

## 2016-11-28 NOTE — ED Triage Notes (Signed)
Pt comes with complaints of right rib pain that has been going on for the past few months.  Pt states she has also had issues with very irregular periods (IE. Will start for one day, one week later start again for 2 days, one week later for 5 days).  Reports always having periods on a 28 day cycle.  Reports these random periods started happening after she experienced extreme cramping around her right rib. Has had unprotected sex lately.

## 2016-11-29 ENCOUNTER — Emergency Department (HOSPITAL_COMMUNITY)
Admission: EM | Admit: 2016-11-29 | Discharge: 2016-11-29 | Disposition: A | Discharge status: Home / Self Care | Payer: Medicaid Other | Attending: Emergency Medicine | Admitting: Emergency Medicine

## 2016-11-29 DIAGNOSIS — N946 Dysmenorrhea, unspecified: Secondary | ICD-10-CM

## 2016-11-29 LAB — COMPREHENSIVE METABOLIC PANEL
ALBUMIN: 4.1 g/dL (ref 3.5–5.0)
ALT: 10 U/L — ABNORMAL LOW (ref 14–54)
ANION GAP: 7 (ref 5–15)
AST: 12 U/L — AB (ref 15–41)
Alkaline Phosphatase: 50 U/L (ref 38–126)
BUN: 14 mg/dL (ref 6–20)
CHLORIDE: 110 mmol/L (ref 101–111)
CO2: 23 mmol/L (ref 22–32)
Calcium: 8.9 mg/dL (ref 8.9–10.3)
Creatinine, Ser: 0.43 mg/dL — ABNORMAL LOW (ref 0.44–1.00)
GFR calc Af Amer: >60 mL/min (ref >60)
GFR calc non Af Amer: >60 mL/min (ref >60)
GLUCOSE: 94 mg/dL (ref 65–99)
POTASSIUM: 3.7 mmol/L (ref 3.5–5.1)
SODIUM: 140 mmol/L (ref 135–145)
Total Bilirubin: 0.4 mg/dL (ref 0.3–1.2)
Total Protein: 7.1 g/dL (ref 6.5–8.1)

## 2016-11-29 LAB — URINALYSIS, ROUTINE W REFLEX MICROSCOPIC
Bilirubin Urine: NEGATIVE
GLUCOSE, UA: NEGATIVE mg/dL
KETONES UR: NEGATIVE mg/dL
NITRITE: NEGATIVE
PROTEIN: NEGATIVE mg/dL
Specific Gravity, Urine: 1.019 (ref 1.005–1.030)
pH: 5 (ref 5.0–8.0)

## 2016-11-29 LAB — CBC
HEMATOCRIT: 33.8 % — AB (ref 36.0–46.0)
HEMOGLOBIN: 11.6 g/dL — AB (ref 12.0–15.0)
MCH: 30.1 pg (ref 26.0–34.0)
MCHC: 34.3 g/dL (ref 30.0–36.0)
MCV: 87.8 fL (ref 78.0–100.0)
Platelets: 191 10*3/uL (ref 150–400)
RBC: 3.85 MIL/uL — ABNORMAL LOW (ref 3.87–5.11)
RDW: 12.2 % (ref 11.5–15.5)
WBC: 5.6 10*3/uL (ref 4.0–10.5)

## 2016-11-29 LAB — I-STAT BETA HCG BLOOD, ED (MC, WL, AP ONLY)

## 2016-11-29 LAB — LIPASE, BLOOD: LIPASE: 31 U/L (ref 11–51)

## 2016-11-29 LAB — WET PREP, GENITAL
Clue Cells Wet Prep HPF POC: NONE SEEN
SPERM: NONE SEEN
TRICH WET PREP: NONE SEEN
WBC, Wet Prep HPF POC: NONE SEEN
Yeast Wet Prep HPF POC: NONE SEEN

## 2016-11-29 MED ORDER — DICYCLOMINE HCL 10 MG PO CAPS
10.0000 mg | ORAL_CAPSULE | Freq: Once | ORAL | Status: DC
  Filled 2016-11-29: qty 1

## 2016-11-29 MED ORDER — NAPROXEN 500 MG PO TABS
500.0000 mg | ORAL_TABLET | Freq: Once | ORAL | Status: DC
  Filled 2016-11-29: qty 1

## 2016-11-29 NOTE — ED Provider Notes (Signed)
[facility]EMER[facility]ENCY DEPT Provider Note   CSN: [id number] Arrival date & time: 11/28/16  2343   By signing my name below, I, Bing Neighbors., attest that this documentation has been prepared under the direction and in the presence of Adryana Mogensen, MD. Electronically signed: Bing Neighbors., ED Scribe. 11/29/16. 3:24 AM.   History   Chief Complaint Chief Complaint  Patient presents with  . Abdominal Pain  . Menstrual Problem    HPI  Theresa Shepherd is a 31 y.o. female with hx of UTI who presents to the Emergency Department complaining of mild to moderate R lower abdominal pain with sudden onset several months ago. Pt states that she has had the R abdominal pain for the past x6-7 months since a UTI. She also reports having irregulars menses since Dec.28th (IE: Will start for one day, one week later start again for 2 days, one week later for 5 days.) Pt reports abdominal pain, dysuria, urgency, decreased urination. Pt has taken 800mg  Ibuprofen with no relief.   The history is provided by the patient. No language interpreter was used.  Abdominal Pain   This is a chronic problem. The current episode started more than 1 week ago. The problem occurs constantly. The problem has not changed since onset.Associated with: worse with her periods which have been coming every several weeks. The pain is located in the RLQ. The pain is moderate. Associated symptoms include constipation and dysuria. Pertinent negatives include anorexia, fever, melena, nausea, vomiting, hematuria, headaches, arthralgias and myalgias. Nothing aggravates the symptoms. Nothing relieves the symptoms. Past workup does not include barium enema. Her past medical history does not include GERD or ulcerative colitis.    Past Medical History:  Diagnosis Date  . Asthma   . UTI (urinary tract infection)     There are no active problems to display for this patient.   Past Surgical History:  Procedure Laterality  Date  . CESAREAN SECTION      OB History    Gravida Para Term Preterm AB Living   1 1 1     1    SAB TAB Ectopic Multiple Live Births                   Home Medications    Prior to Admission medications   Medication Sig Start Date End Date Taking? Authorizing Provider  ibuprofen (ADVIL,MOTRIN) 200 MG tablet Take 600 mg by mouth every 6 (six) hours as needed for moderate pain.   Yes Historical Provider, MD  azithromycin (ZITHROMAX) 250 MG tablet Take 1 tablet (250 mg total) by mouth daily. Take first 2 tablets together, then 1 every day until finished. Patient not taking: Reported on 11/29/2016 10/24/13   Rodolph Bong, MD  HYDROcodone-acetaminophen (NORCO/VICODIN) 5-325 MG per tablet Take 0.5 tablets by mouth at bedtime as needed (cough). Patient not taking: Reported on 11/29/2016 10/24/13   Rodolph Bong, MD  ipratropium (ATROVENT) 0.06 % nasal spray Place 2 sprays into both nostrils 4 (four) times daily. Patient not taking: Reported on 11/29/2016 10/24/13   Rodolph Bong, MD    Family History Family History  Problem Relation Age of Onset  . Diabetes Maternal Grandmother   . Heart disease Maternal Grandmother   . Asthma Other   . Hypertension Other   . Diabetes Other   . Heart disease Paternal Grandfather   . Diabetes Mother   . Hypertension Mother     Social History Social History  Substance  Use Topics  . Smoking status: Never Smoker  . Smokeless tobacco: Never Used  . Alcohol use Yes     Comment: socially     Allergies   Amoxicillin-pot clavulanate   Review of Systems Review of Systems  Constitutional: Negative for chills and fever.  Gastrointestinal: Positive for abdominal pain and constipation. Negative for anorexia, melena, nausea and vomiting.  Genitourinary: Positive for dysuria, menstrual problem, urgency and vaginal bleeding. Negative for flank pain, genital sores, hematuria, vaginal discharge and vaginal pain.  Musculoskeletal: Negative for arthralgias  and myalgias.  Neurological: Negative for headaches.  All other systems reviewed and are negative.    Physical Exam Updated Vital Signs BP 113/81 (BP Location: Left Arm)   Pulse (!) 55   Temp 97.9 F (36.6 C) (Oral)   Resp 18   Ht 5\' 3"  (1.6 m)   Wt 138 lb (62.6 kg)   SpO2 100%   BMI 24.45 kg/m   Physical Exam  Constitutional: She is oriented to person, place, and time. She appears well-developed and well-nourished. No distress.  HENT:  Head: Normocephalic and atraumatic.  Mouth/Throat: Mucous membranes are normal. No oropharyngeal exudate.  Eyes: Conjunctivae are normal. Pupils are equal, round, and reactive to light.  Neck: Normal range of motion. Neck supple.  Cardiovascular: Normal rate and regular rhythm.   No murmur heard. Pulmonary/Chest: Effort normal and breath sounds normal. No respiratory distress. She has no wheezes. She has no rales.  Lungs clear.   Abdominal: Soft. She exhibits no mass. There is no hepatosplenomegaly, splenomegaly or hepatomegaly. There is no tenderness. There is no rigidity, no rebound, no guarding, no tenderness at McBurney's point and negative Murphy's sign.  Constipation present.    Genitourinary: Uterus is not enlarged and not tender. Cervix exhibits no motion tenderness and no friability. Right adnexum displays no mass, no tenderness and no fullness. Left adnexum displays no mass, no tenderness and no fullness. There is bleeding in the vagina. No vaginal discharge found.  Genitourinary Comments: Scant vaginal bleeding, chaperone present.   Musculoskeletal: Normal range of motion. She exhibits no edema or deformity.  Neurological: She is alert and oriented to person, place, and time. She displays normal reflexes.  Skin: Skin is warm and dry. Capillary refill takes less than 2 seconds.  Psychiatric: She has a normal mood and affect.  Nursing note and vitals reviewed.    ED Treatments / Results   Vitals:   11/28/16 2357 11/29/16 0307    BP: 134/91 113/81  Pulse: 84 (!) 55  Resp: 18 18  Temp: 98.2 F (36.8 C) 97.9 F (36.6 C)    DIAGNOSTIC STUDIES: Oxygen Saturation is 100% on RA, normal by my interpretation.   COORDINATION OF CARE: 3:24 AM-Discussed next steps with pt. Pt verbalized understanding and is agreeable with the plan.  Results for orders placed or performed during the hospital encounter of 11/29/16  Wet prep, genital  Result Value Ref Range   Yeast Wet Prep HPF POC NONE SEEN NONE SEEN   Trich, Wet Prep NONE SEEN NONE SEEN   Clue Cells Wet Prep HPF POC NONE SEEN NONE SEEN   WBC, Wet Prep HPF POC NONE SEEN NONE SEEN   Sperm NONE SEEN   Lipase, blood  Result Value Ref Range   Lipase 31 11 - 51 U/L  Comprehensive metabolic panel  Result Value Ref Range   Sodium 140 135 - 145 mmol/L   Potassium 3.7 3.5 - 5.1 mmol/L   Chloride 110 101 -  111 mmol/L   CO2 23 22 - 32 mmol/L   Glucose, Bld 94 65 - 99 mg/dL   BUN 14 6 - 20 mg/dL   Creatinine, Ser 4.090.43 (L) 0.44 - 1.00 mg/dL   Calcium 8.9 8.9 - 81.110.3 mg/dL   Total Protein 7.1 6.5 - 8.1 g/dL   Albumin 4.1 3.5 - 5.0 g/dL   AST 12 (L) 15 - 41 U/L   ALT 10 (L) 14 - 54 U/L   Alkaline Phosphatase 50 38 - 126 U/L   Total Bilirubin 0.4 0.3 - 1.2 mg/dL   GFR calc non Af Amer >60 >60 mL/min   GFR calc Af Amer >60 >60 mL/min   Anion gap 7 5 - 15  CBC  Result Value Ref Range   WBC 5.6 4.0 - 10.5 K/uL   RBC 3.85 (L) 3.87 - 5.11 MIL/uL   Hemoglobin 11.6 (L) 12.0 - 15.0 g/dL   HCT 91.433.8 (L) 78.236.0 - 95.646.0 %   MCV 87.8 78.0 - 100.0 fL   MCH 30.1 26.0 - 34.0 pg   MCHC 34.3 30.0 - 36.0 g/dL   RDW 21.312.2 08.611.5 - 57.815.5 %   Platelets 191 150 - 400 K/uL  Urinalysis, Routine w reflex microscopic  Result Value Ref Range   Color, Urine YELLOW YELLOW   APPearance CLEAR CLEAR   Specific Gravity, Urine 1.019 1.005 - 1.030   pH 5.0 5.0 - 8.0   Glucose, UA NEGATIVE NEGATIVE mg/dL   Hgb urine dipstick LARGE (A) NEGATIVE   Bilirubin Urine NEGATIVE NEGATIVE   Ketones, ur  NEGATIVE NEGATIVE mg/dL   Protein, ur NEGATIVE NEGATIVE mg/dL   Nitrite NEGATIVE NEGATIVE   Leukocytes, UA TRACE (A) NEGATIVE   RBC / HPF 6-30 0 - 5 RBC/hpf   WBC, UA 0-5 0 - 5 WBC/hpf   Bacteria, UA RARE (A) NONE SEEN   Squamous Epithelial / LPF 0-5 (A) NONE SEEN   Mucous PRESENT    Hyaline Casts, UA PRESENT   I-Stat beta hCG blood, ED  Result Value Ref Range   I-stat hCG, quantitative <5.0 <5 mIU/mL   Comment 3           No results found.    Procedures Procedures (including critical care time)  Medications Ordered in ED  Medications  dicyclomine (BENTYL) capsule 10 mg (not administered)  naproxen (NAPROSYN) tablet 500 mg (not administered)       Final Clinical Impressions(s) / ED Diagnoses  Dysmenorrhea/irregular periods: eloped during the owrk up prior to medication  New Prescriptions New Prescriptions   No medications on file  I personally performed the services described in this documentation, which was scribed in my presence. The recorded information has been reviewed and is accurate.       Cy BlamerApril Ovida Delagarza, MD 11/29/16 [id number]

## 2016-11-29 NOTE — ED Notes (Signed)
Went to pt room.  Pt is not in room. No belongings in room.  Looked in surrounding bathrooms and around dept.  Pt appears to have left.

## 2016-12-03 LAB — GC/CHLAMYDIA PROBE AMP (~~LOC~~) NOT AT ARMC
CHLAMYDIA, DNA PROBE: NEGATIVE
Neisseria Gonorrhea: NEGATIVE

## 2017-08-21 ENCOUNTER — Encounter (HOSPITAL_COMMUNITY): Payer: Self-pay | Admitting: *Deleted

## 2017-08-21 ENCOUNTER — Ambulatory Visit (HOSPITAL_COMMUNITY)
Admission: EM | Admit: 2017-08-21 | Discharge: 2017-08-21 | Disposition: A | Discharge status: Home / Self Care | Payer: Self-pay | Attending: Emergency Medicine | Admitting: Emergency Medicine

## 2017-08-21 DIAGNOSIS — H1033 Unspecified acute conjunctivitis, bilateral: Secondary | ICD-10-CM

## 2017-08-21 MED ORDER — OFLOXACIN 0.3 % OP SOLN: 1.0000 [drp] | Freq: Four times a day (QID) | OPHTHALMIC | 0 refills | Status: DC

## 2017-08-21 MED ORDER — AZELASTINE HCL 0.05 % OP SOLN: 1.0000 [drp] | Freq: Two times a day (BID) | OPHTHALMIC | 12 refills | Status: DC

## 2017-08-21 NOTE — ED Notes (Signed)
Unable to complete visual acuity, pt wears glasses and did not bring them.

## 2017-08-21 NOTE — Discharge Instructions (Signed)
Frequent hand washing recommended.

## 2017-08-21 NOTE — ED Provider Notes (Signed)
[facility]    CSN: [id number] Arrival date & time: 08/21/17  1046     History   Chief Complaint Chief Complaint  Patient presents with  . Eye Problem    HPI Theresa Shepherd is a 31 y.o. female presented with CC of itching eyes x 7 days. Pt tried OTC allergy eye drops with no relief in Sx. Getting some drainage for last 3 days and reports today some swelling to eyelid. 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 Some blurriness, states secondary to drainage. Denies contact lens use.   The history is provided by the patient.  Eye Problem  Location:  Both eyes Quality:  Burning Severity:  Moderate Onset quality:  Gradual Timing:  Constant Progression:  Worsening Chronicity:  New Relieved by:  Nothing Associated symptoms: discharge, itching and redness   Associated symptoms: no photophobia     Past Medical History:  Diagnosis Date  . Asthma   . UTI (urinary tract infection)     There are no active problems to display for this patient.   Past Surgical History:  Procedure Laterality Date  . CESAREAN SECTION      OB History    Gravida Para Term Preterm AB Living   1 1 1     1    SAB TAB Ectopic Multiple Live Births                   Home Medications    Prior to Admission medications   Medication Sig Start Date End Date Taking? Authorizing Provider  azelastine (OPTIVAR) 0.05 % ophthalmic solution Place 1 drop into both eyes 2 (two) times daily. 08/21/17   Bryleigh Ottaway, NP  azithromycin (ZITHROMAX) 250 MG tablet Take 1 tablet (250 mg total) by mouth daily. Take first 2 tablets together, then 1 every day until finished. Patient not taking: Reported on 11/29/2016 10/24/13   Rodolph Bong, MD  HYDROcodone-acetaminophen (NORCO/VICODIN) 5-325 MG per tablet Take 0.5 tablets by mouth at bedtime as needed (cough). Patient not taking: Reported on 11/29/2016 10/24/13   Rodolph Bong, MD  ibuprofen (ADVIL,MOTRIN) 200 MG tablet Take  600 mg by mouth every 6 (six) hours as needed for moderate pain.    [provider]  ipratropium (ATROVENT) 0.06 % nasal spray Place 2 sprays into both nostrils 4 (four) times daily. Patient not taking: Reported on 11/29/2016 10/24/13   Rodolph Bong, MD  ofloxacin (OCUFLOX) 0.3 % ophthalmic solution Place 1 drop into both eyes 4 (four) times daily. 08/21/17   Bryam Taborda, NP    Family History Family History  Problem Relation Age of Onset  . Diabetes Maternal Grandmother   . Heart disease Maternal Grandmother   . Asthma Other   . Hypertension Other   . Diabetes Other   . Heart disease Paternal Grandfather   . Diabetes Mother   . Hypertension Mother     Social History Social History  Substance Use Topics  . Smoking status: Never Smoker  . Smokeless tobacco: Never Used  . Alcohol use Yes     Comment: socially     Allergies   Augmentin [amoxicillin-pot clavulanate]   Review of Systems Review of Systems  Constitutional: Negative.   HENT: Negative.   Eyes: Positive for discharge, redness and itching. Negative for photophobia, pain and visual disturbance.  Respiratory: Negative.   Cardiovascular: Negative.   Psychiatric/Behavioral: Negative.      Physical Exam Triage Vital Signs ED Triage  Vitals [08/21/17 1210]  Enc Vitals Group     BP      Pulse      Resp      Temp      Temp src      SpO2      Weight      Height      Head Circumference      Peak Flow      Pain Score 2     Pain Loc      Pain Edu?      Excl. in GC?    No data found.   Updated Vital Signs There were no vitals taken for this visit.  Visual Acuity Right Eye Distance:   Left Eye Distance:   Bilateral Distance:    Right Eye Near:   Left Eye Near:    Bilateral Near:     Physical Exam  Constitutional: She is oriented to person, place, and time. She appears well-developed and well-nourished. No distress.  HENT:  Head: Normocephalic and atraumatic.  Eyes: Pupils are  equal, round, and reactive to light. EOM are normal. Left eye exhibits discharge.  B/L conjunctival injection, left worse than right  Cardiovascular: Normal rate and regular rhythm.   Pulmonary/Chest: Effort normal and breath sounds normal.  Neurological: She is alert and oriented to person, place, and time.  Skin: She is not diaphoretic.     UC Treatments / Results  Labs (all labs ordered are listed, but only abnormal results are displayed) Labs Reviewed - No data to display  EKG  EKG Interpretation None       Radiology No results found.  Procedures Procedures (including critical care time)  Medications Ordered in UC Medications - No data to display   Initial Impression / Assessment and Plan / UC Course  I have reviewed the triage vital signs and the nursing notes.  Pertinent labs & imaging results that were available during my care of the patient were reviewed by me and considered in my medical decision making (see chart for details).    Highly likely bacterial conjunctivitis, however allergic conjunctivitis cannot be R/O. Findings discussed with pt and pt agrees with plan of care to use allergy and ABX eye drops. Pt reports missed work today and start work tomorrow at 7 am. Will cover 2 days off from work as pt will need 24 hrs on ABX.   Final Clinical Impressions(s) / UC Diagnoses   Final diagnoses:  Acute conjunctivitis of both eyes, unspecified acute conjunctivitis type    New Prescriptions New Prescriptions   AZELASTINE (OPTIVAR) 0.05 % OPHTHALMIC SOLUTION    Place 1 drop into both eyes 2 (two) times daily.   OFLOXACIN (OCUFLOX) 0.3 % OPHTHALMIC SOLUTION    Place 1 drop into both eyes 4 (four) times daily.     Controlled Substance Prescriptions [place] Controlled Substance Registry consulted? Not Applicable   Reinaldo RaddleMultani, Advit Trethewey, NP 08/21/17 1234

## 2017-08-21 NOTE — ED Triage Notes (Addendum)
Patient reports itching to eyes x 1 week. Patient has been using otc meds. States that she woke this am with extreme puffiness to eyes. Reports mild blurred vision starting this am. Reports drainage for the last 3 days.

## 2017-09-22 ENCOUNTER — Encounter (HOSPITAL_COMMUNITY): Payer: Self-pay

## 2018-07-22 ENCOUNTER — Ambulatory Visit (INDEPENDENT_AMBULATORY_CARE_PROVIDER_SITE_OTHER): Payer: Self-pay

## 2018-07-22 ENCOUNTER — Other Ambulatory Visit: Payer: Self-pay

## 2018-07-22 ENCOUNTER — Ambulatory Visit (HOSPITAL_COMMUNITY)
Admission: EM | Admit: 2018-07-22 | Discharge: 2018-07-22 | Disposition: A | Discharge status: Home / Self Care | Payer: Self-pay | Attending: Family Medicine | Admitting: Family Medicine

## 2018-07-22 ENCOUNTER — Encounter (HOSPITAL_COMMUNITY): Payer: Self-pay | Admitting: Emergency Medicine

## 2018-07-22 DIAGNOSIS — S60222A Contusion of left hand, initial encounter: Secondary | ICD-10-CM
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
## 2018-07-22 NOTE — Discharge Instructions (Signed)
You may use over the counter ibuprofen or acetaminophen as needed.  ° °
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
## 2018-10-20 ENCOUNTER — Encounter (HOSPITAL_COMMUNITY): Payer: Self-pay | Admitting: Emergency Medicine

## 2018-10-20 ENCOUNTER — Emergency Department (HOSPITAL_COMMUNITY)
Admission: EM | Admit: 2018-10-20 | Discharge: 2018-10-20 | Disposition: A | Discharge status: Home / Self Care | Payer: Self-pay | Attending: Emergency Medicine | Admitting: Emergency Medicine

## 2018-10-20 ENCOUNTER — Emergency Department (HOSPITAL_COMMUNITY): Payer: Self-pay

## 2018-10-20 DIAGNOSIS — J45909 Unspecified asthma, uncomplicated: Secondary | ICD-10-CM | POA: Insufficient documentation

## 2018-10-20 DIAGNOSIS — R072 Precordial pain: Secondary | ICD-10-CM | POA: Insufficient documentation

## 2018-10-20 LAB — URINALYSIS, ROUTINE W REFLEX MICROSCOPIC
Bacteria, UA: NONE SEEN
Bilirubin Urine: NEGATIVE
GLUCOSE, UA: NEGATIVE mg/dL
Ketones, ur: NEGATIVE mg/dL
LEUKOCYTES UA: NEGATIVE
NITRITE: NEGATIVE
PROTEIN: NEGATIVE mg/dL
Specific Gravity, Urine: 1.003 — ABNORMAL LOW (ref 1.005–1.030)
pH: 5 (ref 5.0–8.0)

## 2018-10-20 LAB — CBC
HEMATOCRIT: 39.4 % (ref 36.0–46.0)
HEMOGLOBIN: 12.9 g/dL (ref 12.0–15.0)
MCH: 30 pg (ref 26.0–34.0)
MCHC: 32.7 g/dL (ref 30.0–36.0)
MCV: 91.6 fL (ref 80.0–100.0)
Platelets: 207 10*3/uL (ref 150–400)
RBC: 4.3 MIL/uL (ref 3.87–5.11)
RDW: 11.9 % (ref 11.5–15.5)
WBC: 10.8 10*3/uL — ABNORMAL HIGH (ref 4.0–10.5)
nRBC: 0 % (ref 0.0–0.2)

## 2018-10-20 LAB — LIPASE, BLOOD: LIPASE: 40 U/L (ref 11–51)

## 2018-10-20 LAB — BASIC METABOLIC PANEL
Anion gap: 10 (ref 5–15)
BUN: 11 mg/dL (ref 6–20)
CHLORIDE: 107 mmol/L (ref 98–111)
CO2: 22 mmol/L (ref 22–32)
Calcium: 9.2 mg/dL (ref 8.9–10.3)
Creatinine, Ser: 0.66 mg/dL (ref 0.44–1.00)
GFR calc Af Amer: >60 mL/min (ref >60)
GFR calc non Af Amer: >60 mL/min (ref >60)
GLUCOSE: 99 mg/dL (ref 70–99)
POTASSIUM: 3.8 mmol/L (ref 3.5–5.1)
Sodium: 139 mmol/L (ref 135–145)

## 2018-10-20 LAB — I-STAT TROPONIN, ED: Troponin i, poc: 0 ng/mL (ref 0.00–0.08)

## 2018-10-20 LAB — I-STAT BETA HCG BLOOD, ED (MC, WL, AP ONLY): I-stat hCG, quantitative: <5 m[IU]/mL (ref <5)

## 2018-10-20 MED ORDER — LIDOCAINE VISCOUS HCL 2 % MT SOLN
15.0000 mL | Freq: Once | OROMUCOSAL | Status: AC
  Administered 2018-10-20: 15 mL via ORAL
  Filled 2018-10-20: qty 15

## 2018-10-20 MED ORDER — FAMOTIDINE 20 MG PO TABS: 20.0000 mg | ORAL_TABLET | Freq: Two times a day (BID) | ORAL | 0 refills | Status: DC

## 2018-10-20 MED ORDER — ALUM & MAG HYDROXIDE-SIMETH 200-200-20 MG/5ML PO SUSP
30.0000 mL | Freq: Once | ORAL | Status: AC
  Administered 2018-10-20: 30 mL via ORAL
  Filled 2018-10-20: qty 30

## 2018-10-20 NOTE — ED Provider Notes (Signed)
[facility] EMERGENCY DEPARTMENT Provider Note   CSN: [id number] Arrival date & time: 10/20/18  0036     History   Chief Complaint Chief Complaint  Patient presents with  . Chest Pain    HPI Theresa Shepherd is a 32 y.o. female.  Patient with history of asthma, smoking, presents with complaint of chest pain that started after waking up yesterday morning.  Symptoms started at rest.  Patient describes the pain in her mid chest.  Pain is been constant, waxing and waning since that time.  Patient felt like she had indigestion.  She took Tums throughout the day with temporary relief but the pain would return.  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  She developed severe pain around 10 PM.  She took ibuprofen and a Gas-X which helped improve the pain.  Patient came to the emergency department for further evaluation.  While waiting, her pain is become slightly worse.  She denies any associated diaphoresis, lightheadedness or syncope, vomiting.  No shortness of breath.  Patient has gotten similar pain after drinking alcohol in the past.  No known history of hypertension, high cholesterol, diabetes.  Patient does have a family history of coronary artery disease. Patient denies risk factors for pulmonary embolism including: unilateral leg swelling, history of DVT/PE/other blood clots, use of exogenous hormones, recent immobilizations, recent surgery, recent travel (>4hr segment), malignancy, hemoptysis.       Past Medical History:  Diagnosis Date  . Asthma   . UTI (urinary tract infection)     There are no active problems to display for this patient.   Past Surgical History:  Procedure Laterality Date  . CESAREAN SECTION       OB History    Gravida  1   Para  1   Term  1   Preterm      AB      Living  1     SAB      TAB      Ectopic      Multiple      Live Births               Home Medications    Prior to Admission medications     Medication Sig Start Date End Date Taking? Authorizing Provider  azelastine (OPTIVAR) 0.05 % ophthalmic solution Place 1 drop into both eyes 2 (two) times daily. 08/21/17   Multani, Bhupinder, NP  azithromycin (ZITHROMAX) 250 MG tablet Take 1 tablet (250 mg total) by mouth daily. Take first 2 tablets together, then 1 every day until finished. Patient not taking: Reported on 11/29/2016 10/24/13   Rodolph Bong, MD  HYDROcodone-acetaminophen (NORCO/VICODIN) 5-325 MG per tablet Take 0.5 tablets by mouth at bedtime as needed (cough). Patient not taking: Reported on 11/29/2016 10/24/13   Rodolph Bong, MD  ibuprofen (ADVIL,MOTRIN) 200 MG tablet Take 600 mg by mouth every 6 (six) hours as needed for moderate pain.    [provider]  ipratropium (ATROVENT) 0.06 % nasal spray Place 2 sprays into both nostrils 4 (four) times daily. Patient not taking: Reported on 11/29/2016 10/24/13   Rodolph Bong, MD  ofloxacin (OCUFLOX) 0.3 % ophthalmic solution Place 1 drop into both eyes 4 (four) times daily. 08/21/17   Multani, Bhupinder, NP    Family History Family History  Problem Relation Age of Onset  . Diabetes Maternal Grandmother   . Heart disease Maternal Grandmother   . Asthma  Other   . Hypertension Other   . Diabetes Other   . Heart disease Paternal Grandfather   . Diabetes Mother   . Hypertension Mother     Social History Social History   Tobacco Use  . Smoking status: Never Smoker  . Smokeless tobacco: Never Used  Substance Use Topics  . Alcohol use: Yes    Comment: socially  . Drug use: Yes    Types: Marijuana     Allergies   Augmentin [amoxicillin-pot clavulanate]   Review of Systems Review of Systems  Constitutional: Negative for diaphoresis and fever.  Eyes: Negative for redness.  Respiratory: Negative for cough and shortness of breath.   Cardiovascular: Positive for chest pain. Negative for palpitations and leg swelling.  Gastrointestinal: Negative for abdominal  pain, nausea and vomiting.  Genitourinary: Negative for dysuria.  Musculoskeletal: Negative for back pain and neck pain.  Skin: Negative for rash.  Neurological: Negative for syncope and light-headedness.  Psychiatric/Behavioral: The patient is not nervous/anxious.      Physical Exam Updated Vital Signs BP 114/73 (BP Location: Right Arm)   Pulse 69   Temp 98.2 F (36.8 C) (Oral)   Resp 18   LMP 09/23/2018 (Exact Date)   SpO2 100%   Physical Exam Vitals signs and nursing note reviewed.  Constitutional:      Appearance: She is well-developed. She is not diaphoretic.  HENT:     Head: Normocephalic and atraumatic.     Mouth/Throat:     Mouth: Mucous membranes are not dry.  Eyes:     Conjunctiva/sclera: Conjunctivae normal.  Neck:     Musculoskeletal: Normal range of motion and neck supple. No muscular tenderness.     Vascular: Normal carotid pulses. No carotid bruit or JVD.     Trachea: Trachea normal. No tracheal deviation.  Cardiovascular:     Rate and Rhythm: Normal rate and regular rhythm.     Pulses: No decreased pulses.     Heart sounds: Normal heart sounds, S1 normal and S2 normal. No murmur.  Pulmonary:     Effort: Pulmonary effort is normal. No respiratory distress.     Breath sounds: No wheezing.  Chest:     Chest wall: No tenderness.  Abdominal:     General: Bowel sounds are normal.     Palpations: Abdomen is soft.     Tenderness: There is no abdominal tenderness. There is no guarding or rebound.  Musculoskeletal: Normal range of motion.  Skin:    General: Skin is warm and dry.     Coloration: Skin is not pale.  Neurological:     Mental Status: She is alert.      ED Treatments / Results  Labs (all labs ordered are listed, but only abnormal results are displayed) Labs Reviewed  CBC - Abnormal; Notable for the following components:      Result Value   WBC 10.8 (*)    All other components within normal limits  URINALYSIS, ROUTINE W REFLEX  MICROSCOPIC - Abnormal; Notable for the following components:   Color, Urine STRAW (*)    Specific Gravity, Urine 1.003 (*)    Hgb urine dipstick SMALL (*)    All other components within normal limits  BASIC METABOLIC PANEL  LIPASE, BLOOD  I-STAT TROPONIN, ED  I-STAT BETA HCG BLOOD, ED ([facility], [facility], [facility] ONLY)  I-STAT BETA HCG BLOOD, ED ([facility], [facility], [facility] ONLY)    EKG EKG Interpretation  Date/Time:  Tuesday October 20 2018 00:46:29 EST Ventricular  Rate:  88 PR Interval:  162 QRS Duration: 80 QT Interval:  340 QTC Calculation: 411 R Axis:   37 Text Interpretation:  Normal sinus rhythm Normal ECG No previous ECGs available Confirmed by Glynn Octave [phone]) on 10/20/2018 4:57:30 AM   Radiology Dg Chest 2 View  Result Date: 10/20/2018 CLINICAL DATA:  Substernal chest pain. EXAM: CHEST - 2 VIEW COMPARISON:  04/16/2013 FINDINGS: The cardiomediastinal contours are normal. The lungs are clear. Pulmonary vasculature is normal. No consolidation, pleural effusion, or pneumothorax. No acute osseous abnormalities are seen. IMPRESSION: Negative radiographs of the chest. Electronically Signed   By: Narda Rutherford M.D.   On: 10/20/2018 01:56    Procedures Procedures (including critical care time)  Medications Ordered in ED Medications  alum & mag hydroxide-simeth (MAALOX/MYLANTA) 200-200-20 MG/5ML suspension 30 mL (has no administration in time range)    And  lidocaine (XYLOCAINE) 2 % viscous mouth solution 15 mL (has no administration in time range)     Initial Impression / Assessment and Plan / ED Course  I have reviewed the triage vital signs and the nursing notes.  Pertinent labs & imaging results that were available during my care of the patient were reviewed by me and considered in my medical decision making (see chart for details).     Patient seen and examined. Work-up initiated. Medications ordered. EKG reviewed.   Vital signs reviewed and are as follows: BP 114/73 (BP  Location: Right Arm)   Pulse 69   Temp 98.2 F (36.8 C) (Oral)   Resp 18   LMP 09/23/2018 (Exact Date)   SpO2 100%   6:27 AM patient in no distress.  Given that her pain is getting slightly worse, will give GI cocktail.  I suspect a GI etiology in this patient.  No concerning risk factors for ACS, PE.  She is PERC negative.  Cardiac work-up here is negative.  Symptoms greater than 8 hours, do not need second troponin.  We will discharged home with Pepcid.  Encourage bland diet for the next several days.  Encouraged PCP f/u in 1 week for recheck of sx.   Patient was counseled to return with severe chest pain, especially if the pain is crushing or pressure-like and spreads to the arms, back, neck, or jaw, or if they have sweating, nausea, or shortness of breath with the pain. They were encouraged to call 911 with these symptoms.   The patient verbalized understanding and agreed.    Final Clinical Impressions(s) / ED Diagnoses   Final diagnoses:  Precordial pain   Patient with chest pain relieved with antacids.  Pain is similar to previous indigestion and pain that she got after drinking alcoholic beverages.  Patient had a negative cardiac work-up here.  No risk factors for PE.  Will treat conservatively.  No indications for admission at this time.  ED Discharge Orders         Ordered    famotidine (PEPCID) 20 MG tablet  2 times daily     10/20/18 0624           Renne Crigler, PA-C 10/20/18 4098    Glynn Octave, MD 10/20/18 [phone]

## 2018-10-20 NOTE — ED Triage Notes (Signed)
Pt reports substernal chest pain to upper abdominal pain that started Monday morning. The pain increased tonight.  Denies N/V/D/SOB.  Pt reports she just "does not feel normal."  Pain comes and goes. Took gas X, and Tums all day.

## 2018-10-20 NOTE — Discharge Instructions (Signed)
Please read and follow all provided instructions.  Your diagnoses today include:  1. Precordial pain     Tests performed today include:  An EKG of your heart  A chest x-ray  Cardiac enzymes - a blood test for heart muscle damage  Blood counts and electrolytes  Vital signs. See below for your results today.   Medications prescribed:   Pepcid (famotidine) - antihistamine  You can find this medication over-the-counter.   DO NOT exceed:   20mg  Pepcid every 12 hours  Take any prescribed medications only as directed.  Follow-up instructions: Please follow-up with your primary care provider as soon as you can for further evaluation of your symptoms.   Return instructions:  SEEK IMMEDIATE MEDICAL ATTENTION IF:  You have severe chest pain, especially if the pain is crushing or pressure-like and spreads to the arms, back, neck, or jaw, or if you have sweating, nausea (feeling sick to your stomach), or shortness of breath. THIS IS AN EMERGENCY. Don't wait to see if the pain will go away. Get medical help at once. Call 911 or 0 (operator). DO NOT drive yourself to the hospital.   Your chest pain gets worse and does not go away with rest.   You have an attack of chest pain lasting longer than usual, despite rest and treatment with the medications your caregiver has prescribed.   You wake from sleep with chest pain or shortness of breath.  You feel dizzy or faint.  You have chest pain not typical of your usual pain for which you originally saw your caregiver.   You have any other emergent concerns regarding your health.  Additional Information: Chest pain comes from many different causes. Your caregiver has diagnosed you as having chest pain that is not specific for one problem, but does not require admission.  You are at low risk for an acute heart condition or other serious illness.   Your vital signs today were: BP 114/73 (BP Location: Right Arm)    Pulse 69    Temp 98.2 F  (36.8 C) (Oral)    Resp 18    LMP 09/23/2018 (Exact Date)    SpO2 100%  If your blood pressure (BP) was elevated above 135/85 this visit, please have this repeated by your doctor within one month. --------------

## 2019-03-21 ENCOUNTER — Encounter (HOSPITAL_COMMUNITY): Payer: Self-pay

## 2019-03-21 ENCOUNTER — Inpatient Hospital Stay (HOSPITAL_COMMUNITY): Payer: Medicaid Other

## 2019-03-21 ENCOUNTER — Inpatient Hospital Stay (HOSPITAL_COMMUNITY)
Admission: AD | Admit: 2019-03-21 | Discharge: 2019-03-21 | Disposition: A | Discharge status: Home / Self Care | Payer: Medicaid Other | Attending: Obstetrics and Gynecology | Admitting: Obstetrics and Gynecology

## 2019-03-21 ENCOUNTER — Other Ambulatory Visit: Payer: Self-pay

## 2019-03-21 DIAGNOSIS — O208 Other hemorrhage in early pregnancy: Secondary | ICD-10-CM | POA: Insufficient documentation

## 2019-03-21 DIAGNOSIS — Z880 Allergy status to penicillin: Secondary | ICD-10-CM | POA: Insufficient documentation

## 2019-03-21 DIAGNOSIS — O219 Vomiting of pregnancy, unspecified: Secondary | ICD-10-CM

## 2019-03-21 DIAGNOSIS — O210 Mild hyperemesis gravidarum: Secondary | ICD-10-CM | POA: Insufficient documentation

## 2019-03-21 DIAGNOSIS — O26891 Other specified pregnancy related conditions, first trimester: Secondary | ICD-10-CM

## 2019-03-21 DIAGNOSIS — Z3201 Encounter for pregnancy test, result positive: Secondary | ICD-10-CM

## 2019-03-21 DIAGNOSIS — Z3A01 Less than 8 weeks gestation of pregnancy: Secondary | ICD-10-CM | POA: Insufficient documentation

## 2019-03-21 DIAGNOSIS — F1290 Cannabis use, unspecified, uncomplicated: Secondary | ICD-10-CM | POA: Diagnosis not present

## 2019-03-21 DIAGNOSIS — O99321 Drug use complicating pregnancy, first trimester: Secondary | ICD-10-CM | POA: Diagnosis not present

## 2019-03-21 DIAGNOSIS — F1210 Cannabis abuse, uncomplicated: Secondary | ICD-10-CM

## 2019-03-21 DIAGNOSIS — R109 Unspecified abdominal pain: Secondary | ICD-10-CM

## 2019-03-21 LAB — CBC WITH DIFFERENTIAL/PLATELET
Abs Immature Granulocytes: 0.03 10*3/uL (ref 0.00–0.07)
Basophils Absolute: 0 10*3/uL (ref 0.0–0.1)
Basophils Relative: 0 %
Eosinophils Absolute: 0.1 10*3/uL (ref 0.0–0.5)
Eosinophils Relative: 1 %
HCT: 36.9 % (ref 36.0–46.0)
Hemoglobin: 12.9 g/dL (ref 12.0–15.0)
Immature Granulocytes: 0 %
Lymphocytes Relative: 16 %
Lymphs Abs: 1.3 10*3/uL (ref 0.7–4.0)
MCH: 31.3 pg (ref 26.0–34.0)
MCHC: 35 g/dL (ref 30.0–36.0)
MCV: 89.6 fL (ref 80.0–100.0)
Monocytes Absolute: 0.6 10*3/uL (ref 0.1–1.0)
Monocytes Relative: 8 %
Neutro Abs: 5.9 10*3/uL (ref 1.7–7.7)
Neutrophils Relative %: 75 %
Platelets: 199 10*3/uL (ref 150–400)
RBC: 4.12 MIL/uL (ref 3.87–5.11)
RDW: 11.8 % (ref 11.5–15.5)
WBC: 8 10*3/uL (ref 4.0–10.5)
nRBC: 0 % (ref 0.0–0.2)

## 2019-03-21 LAB — HCG, QUANTITATIVE, PREGNANCY: hCG, Beta Chain, Quant, S: 80416 m[IU]/mL — ABNORMAL HIGH (ref <5)

## 2019-03-21 LAB — URINALYSIS, ROUTINE W REFLEX MICROSCOPIC
Bilirubin Urine: NEGATIVE
Glucose, UA: NEGATIVE mg/dL
Hgb urine dipstick: NEGATIVE
Ketones, ur: NEGATIVE mg/dL
Leukocytes,Ua: NEGATIVE
Nitrite: NEGATIVE
Protein, ur: NEGATIVE mg/dL
Specific Gravity, Urine: 1.018 (ref 1.005–1.030)
pH: 8 (ref 5.0–8.0)

## 2019-03-21 LAB — POCT PREGNANCY, URINE: Preg Test, Ur: POSITIVE — AB

## 2019-03-21 MED ORDER — PYRIDOXINE HCL 25 MG PO TABS: 25.0000 mg | ORAL_TABLET | Freq: Three times a day (TID) | ORAL | 0 refills | Status: DC

## 2019-03-21 MED ORDER — DOXYLAMINE SUCCINATE (SLEEP) 25 MG PO TABS: 25.0000 mg | ORAL_TABLET | Freq: Three times a day (TID) | ORAL | 0 refills | Status: DC | PRN

## 2019-03-21 MED ORDER — ONDANSETRON 4 MG PO TBDP
4.0000 mg | ORAL_TABLET | Freq: Once | ORAL | Status: AC
  Administered 2019-03-21: 12:00:00 4 mg via ORAL
  Filled 2019-03-21: qty 1

## 2019-03-21 MED ORDER — PREPLUS 27-1 MG PO TABS: 1.0000 | ORAL_TABLET | Freq: Every day | ORAL | 13 refills | Status: DC

## 2019-03-21 NOTE — MAU Note (Signed)
Pt reports to mau with c/o nausea.  Pt denies any vomiting today.  Pt denies abd. Pain or bleeding.

## 2019-03-21 NOTE — Discharge Instructions (Signed)

## 2019-03-21 NOTE — MAU Provider Note (Addendum)
History     CSN: [id number]  Arrival date and time: 03/21/19 [id number]   First Provider Initiated Contact with Patient 03/21/19 1046      Chief Complaint  Patient presents with  . Nausea   HPI Theresa Shepherd is a 33 y.o. G2P1001 at 1872w4d who presents to MAU with chief complaint of abdominal cramping and nausea and vomiting. She denies urinary complaints, vaginal bleeding, abnormal vaginal discharge, fever or recent illness.  Abdominal pain Patient denies abdominal pain in triage but reports suprapubic and RLQ pain that is  "crampy like a period" to CNM. This is a new problem, onset with pregnancy. Her pain ranges from 2-8/10 and typically spikes just prior to episodes of vomiting. She denies aggravating or alleviating factors.  Nausea and vomiting This is a new problem. Patient states she has been managing her nausea with "small puffs" of THC but is now feeling nauseated between episodes of smoking.   OB History    Gravida  2   Para  1   Term  1   Preterm      AB      Living  1     SAB      TAB      Ectopic      Multiple      Live Births              Past Medical History:  Diagnosis Date  . Asthma   . UTI (urinary tract infection)     Past Surgical History:  Procedure Laterality Date  . CESAREAN SECTION      Family History  Problem Relation Age of Onset  . Diabetes Maternal Grandmother   . Heart disease Maternal Grandmother   . Asthma Other   . Hypertension Other   . Diabetes Other   . Heart disease Paternal Grandfather   . Diabetes Mother   . Hypertension Mother   . COPD Mother   . Hypertension Father     Social History   Tobacco Use  . Smoking status: Never Smoker  . Smokeless tobacco: Never Used  Substance Use Topics  . Alcohol use: Not Currently    Comment: socially  . Drug use: Not Currently    Types: Marijuana    Allergies:  Allergies  Allergen Reactions  . Augmentin [Amoxicillin-Pot Clavulanate] Hives    Medications  Prior to Admission  Medication Sig Dispense Refill Last Dose  . azelastine (OPTIVAR) 0.05 % ophthalmic solution Place 1 drop into both eyes 2 (two) times daily. 6 mL 12   . azithromycin (ZITHROMAX) 250 MG tablet Take 1 tablet (250 mg total) by mouth daily. Take first 2 tablets together, then 1 every day until finished. (Patient not taking: Reported on 11/29/2016) 6 tablet 0 Not Taking at Unknown time  . famotidine (PEPCID) 20 MG tablet Take 1 tablet (20 mg total) by mouth 2 (two) times daily. 30 tablet 0   . HYDROcodone-acetaminophen (NORCO/VICODIN) 5-325 MG per tablet Take 0.5 tablets by mouth at bedtime as needed (cough). (Patient not taking: Reported on 11/29/2016) 6 tablet 0 Not Taking at Unknown time  . ibuprofen (ADVIL,MOTRIN) 200 MG tablet Take 600 mg by mouth every 6 (six) hours as needed for moderate pain.   11/28/2016 at Unknown time  . ipratropium (ATROVENT) 0.06 % nasal spray Place 2 sprays into both nostrils 4 (four) times daily. (Patient not taking: Reported on 11/29/2016) 15 mL 1 Not Taking at Unknown time  . ofloxacin (OCUFLOX) 0.3 %  ophthalmic solution Place 1 drop into both eyes 4 (four) times daily. 5 mL 0     Review of Systems  Constitutional: Negative for chills, fatigue and fever.  Respiratory: Negative for shortness of breath.   Gastrointestinal: Positive for abdominal pain, nausea and vomiting.  Genitourinary: Negative for difficulty urinating, dysuria, flank pain, vaginal bleeding, vaginal discharge and vaginal pain.  Musculoskeletal: Positive for back pain.  All other systems reviewed and are negative.  Physical Exam   Blood pressure 112/67, pulse 70, temperature 97.6 F (36.4 C), temperature source Oral, resp. rate 18, last menstrual period 02/03/2019, SpO2 98 %.  Physical Exam  Nursing note and vitals reviewed. Constitutional: She is oriented to person, place, and time. She appears ill. She appears distressed.  Respiratory: Effort normal. No respiratory distress.   GI: Soft. She exhibits no distension. There is no abdominal tenderness. There is no rebound and no guarding.  Neurological: She is alert and oriented to person, place, and time.  Skin: Skin is dry.  Psychiatric: She has a normal mood and affect. Her behavior is normal. Judgment and thought content normal.    MAU Course/MDM  Procedures  --Patient kept for ectopic workup based on inconsistent report of sharp abdominal pain which radiates to her low back. She is noticeably ill-appearing --No concerning findings on physical exam  Patient Vitals for the past 24 hrs:  BP Temp Temp src Pulse Resp SpO2  03/21/19 1243 113/60 - - 75 - -  03/21/19 1000 112/67 97.6 F (36.4 C) Oral 70 18 98 %    Results for orders placed or performed during the hospital encounter of 03/21/19 (from the past 24 hour(s))  Pregnancy, urine POC     Status: Abnormal   Collection Time: 03/21/19  9:58 AM  Result Value Ref Range   Preg Test, Ur POSITIVE (A) NEGATIVE  Urinalysis, Routine w reflex microscopic     Status: None   Collection Time: 03/21/19  9:58 AM  Result Value Ref Range   Color, Urine YELLOW YELLOW   APPearance CLEAR CLEAR   Specific Gravity, Urine 1.018 1.005 - 1.030   pH 8.0 5.0 - 8.0   Glucose, UA NEGATIVE NEGATIVE mg/dL   Hgb urine dipstick NEGATIVE NEGATIVE   Bilirubin Urine NEGATIVE NEGATIVE   Ketones, ur NEGATIVE NEGATIVE mg/dL   Protein, ur NEGATIVE NEGATIVE mg/dL   Nitrite NEGATIVE NEGATIVE   Leukocytes,Ua NEGATIVE NEGATIVE  CBC with Differential/Platelet     Status: None   Collection Time: 03/21/19 11:08 AM  Result Value Ref Range   WBC 8.0 4.0 - 10.5 K/uL   RBC 4.12 3.87 - 5.11 MIL/uL   Hemoglobin 12.9 12.0 - 15.0 g/dL   HCT 95.6 21.3 - 08.6 %   MCV 89.6 80.0 - 100.0 fL   MCH 31.3 26.0 - 34.0 pg   MCHC 35.0 30.0 - 36.0 g/dL   RDW 57.8 46.9 - 62.9 %   Platelets 199 150 - 400 K/uL   nRBC 0.0 0.0 - 0.2 %   Neutrophils Relative % 75 %   Neutro Abs 5.9 1.7 - 7.7 K/uL   Lymphocytes  Relative 16 %   Lymphs Abs 1.3 0.7 - 4.0 K/uL   Monocytes Relative 8 %   Monocytes Absolute 0.6 0.1 - 1.0 K/uL   Eosinophils Relative 1 %   Eosinophils Absolute 0.1 0.0 - 0.5 K/uL   Basophils Relative 0 %   Basophils Absolute 0.0 0.0 - 0.1 K/uL   Immature Granulocytes 0 %  Abs Immature Granulocytes 0.03 0.00 - 0.07 K/uL  hCG, quantitative, pregnancy     Status: Abnormal   Collection Time: 03/21/19 11:08 AM  Result Value Ref Range   hCG, Beta Chain, Quant, S 80,416 (H) <5 mIU/mL    US Ob Less Than 14 Weeks With Ob Transvaginal  Result Date: 03/21/2019 CLINICAL DATA:  Abdominal pain. EXAM: OBSTETRIC <14 WK [place] AND TRANSVAGINAL OB US TECHNIQUE: Both transabdominal and transvaginal ultrasound examinations were performed for complete evaluation of the gestation as well as the maternal uterus, adnexal regions, and pelvic cul-de-sac. Transvaginal technique was performed to assess early pregnancy. COMPARISON:  None. FINDINGS: Intrauterine gestational sac: Single Yolk sac:  Visualized Embryo:  Visualized Cardiac Activity: Visualized Heart Rate: 109 bpm CRL:  2.59 mm   5 w   5 d                  [place] EDC: 11/16/2019 Maternal uterus/adnexae: Subchorionic hemorrhage: Small Right ovary: Normal Left ovary: Normal Other :None Free fluid:  None IMPRESSION: 1. Single living intrauterine gestation has an estimated gestational age of [redacted] weeks and 5 days. 2. Small subchorionic hemorrhage. Electronically Signed   By: Signa Kell M.D.   On: 03/21/2019 11:51    Meds ordered this encounter  Medications  . ondansetron (ZOFRAN-ODT) disintegrating tablet 4 mg  . Prenatal Vit-Fe Fumarate-FA (PREPLUS) 27-1 MG TABS    Sig: Take 1 tablet by mouth daily.    Dispense:  30 tablet    Refill:  13    Order Specific Question:   Supervising Provider    Answer:   Reva Bores [[id number]]  . pyridOXINE (VITAMIN B-6) 25 MG tablet    Sig: Take 1 tablet (25 mg total) by mouth every 8 (eight) hours.    Dispense:  30 tablet     Refill:  0    Order Specific Question:   Supervising Provider    Answer:   Reva Bores [[id number]]  . doxylamine, Sleep, (UNISOM) 25 MG tablet    Sig: Take 1 tablet (25 mg total) by mouth every 8 (eight) hours as needed.    Dispense:  30 tablet    Refill:  0    Order Specific Question:   Supervising Provider    Answer:   Reva Bores [[id number]]    Assessment and Plan  --33 y.o. G2P1001 with SIUP at [redacted]w[redacted]d by US performed today --Subchorionic hemorrhage. Advised pelvic rest, possibility of bleeding episodes --Nausea/vomiting --THC use per pt report, concern for Cannabis Hyperemesis.  --Tolerating PO prior to discharge --Discharge home in stable condition  F/U: Pt to establish prenatal care around 11-[redacted] weeks GA   Calvert Cantor, CNM 03/21/2019, 2:00 PM

## 2019-09-01 DIAGNOSIS — S80211A Abrasion, right knee, initial encounter: Secondary | ICD-10-CM | POA: Diagnosis not present

## 2019-09-01 DIAGNOSIS — L03116 Cellulitis of left lower limb: Secondary | ICD-10-CM | POA: Diagnosis not present

## 2019-09-01 DIAGNOSIS — R200 Anesthesia of skin: Secondary | ICD-10-CM | POA: Diagnosis not present

## 2019-09-01 DIAGNOSIS — F17210 Nicotine dependence, cigarettes, uncomplicated: Secondary | ICD-10-CM | POA: Diagnosis not present

## 2019-09-01 DIAGNOSIS — S9782XA Crushing injury of left foot, initial encounter: Secondary | ICD-10-CM | POA: Diagnosis not present

## 2019-09-01 DIAGNOSIS — M25561 Pain in right knee: Secondary | ICD-10-CM | POA: Diagnosis not present

## 2019-09-28 ENCOUNTER — Other Ambulatory Visit: Payer: Self-pay

## 2019-09-28 ENCOUNTER — Encounter (HOSPITAL_COMMUNITY): Payer: Self-pay | Admitting: Emergency Medicine

## 2019-09-28 ENCOUNTER — Ambulatory Visit (HOSPITAL_COMMUNITY)
Admission: EM | Admit: 2019-09-28 | Discharge: 2019-09-28 | Disposition: A | Discharge status: Home / Self Care | Payer: Medicaid Other | Attending: Family Medicine | Admitting: Family Medicine

## 2019-09-28 DIAGNOSIS — N926 Irregular menstruation, unspecified: Secondary | ICD-10-CM | POA: Diagnosis not present

## 2019-09-28 DIAGNOSIS — L2082 Flexural eczema: Secondary | ICD-10-CM | POA: Insufficient documentation

## 2019-09-28 DIAGNOSIS — R109 Unspecified abdominal pain: Secondary | ICD-10-CM | POA: Diagnosis not present

## 2019-09-28 LAB — POCT URINALYSIS DIP (DEVICE)
Bilirubin Urine: NEGATIVE
Glucose, UA: NEGATIVE mg/dL
Ketones, ur: NEGATIVE mg/dL
Leukocytes,Ua: NEGATIVE
Nitrite: NEGATIVE
Protein, ur: NEGATIVE mg/dL
Specific Gravity, Urine: 1.015 (ref 1.005–1.030)
Urobilinogen, UA: 0.2 mg/dL (ref 0.0–1.0)
pH: 6 (ref 5.0–8.0)

## 2019-09-28 MED ORDER — KETOCONAZOLE 2 % EX CREA: 1.0000 "application " | TOPICAL_CREAM | Freq: Two times a day (BID) | CUTANEOUS | 1 refills | Status: DC

## 2019-09-28 MED ORDER — TRIAMCINOLONE ACETONIDE 0.1 % EX CREA: 1.0000 "application " | TOPICAL_CREAM | Freq: Two times a day (BID) | CUTANEOUS | 1 refills | Status: DC

## 2019-09-28 NOTE — ED Triage Notes (Signed)
Patient has itchy areas of skin, red patches of skin under arms, folds of skin and isolated bumps scattered.   Noticed rash / itching since last Wednesday 09/22/2019.  Patient has eczema.    Patient reports uti symptoms.  Last Tuesday 09/21/2019.  Burning with urination.  Patient has mentioned rash in perineum area.
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
## 2019-09-28 NOTE — Discharge Instructions (Addendum)
Continue the [place] a gynecologist about the change in menstrual pattern  The urine test performed today shows no sign of infection

## 2019-09-29 LAB — URINE CULTURE: Culture: 30000 — AB

## 2019-10-18 ENCOUNTER — Ambulatory Visit: Payer: Medicaid Other | Admitting: Advanced Practice Midwife

## 2019-10-18 ENCOUNTER — Other Ambulatory Visit: Payer: Self-pay

## 2019-10-18 ENCOUNTER — Encounter: Payer: Self-pay | Admitting: Advanced Practice Midwife

## 2019-10-18 VITALS — BP 109/63 | Ht 63.0 in | Wt 141.4 lb

## 2019-10-18 DIAGNOSIS — Z113 Encounter for screening for infections with a predominantly sexual mode of transmission: Secondary | ICD-10-CM | POA: Diagnosis not present

## 2019-10-18 DIAGNOSIS — Z3009 Encounter for other general counseling and advice on contraception: Secondary | ICD-10-CM

## 2019-10-18 DIAGNOSIS — Z1388 Encounter for screening for disorder due to exposure to contaminants: Secondary | ICD-10-CM | POA: Diagnosis not present

## 2019-10-18 DIAGNOSIS — F1290 Cannabis use, unspecified, uncomplicated: Secondary | ICD-10-CM

## 2019-10-18 DIAGNOSIS — Z0389 Encounter for observation for other suspected diseases and conditions ruled out: Secondary | ICD-10-CM | POA: Diagnosis not present

## 2019-10-18 LAB — WET PREP FOR TRICH, YEAST, CLUE
Trichomonas Exam: NEGATIVE
Yeast Exam: NEGATIVE

## 2019-10-18 LAB — PREGNANCY, URINE: Preg Test, Ur: NEGATIVE

## 2019-10-18 MED ORDER — NORGESTIM-ETH ESTRAD TRIPHASIC 0.18/0.215/0.25 MG-35 MCG PO TABS: 1.0000 | ORAL_TABLET | Freq: Every day | ORAL | 0 refills | Status: DC

## 2019-10-18 NOTE — Progress Notes (Signed)
PT Negative. [employer] results reviewed. No treatment per standing orders indicated. Hal Morales, RN

## 2019-10-18 NOTE — Addendum Note (Signed)
Addended by: Hal Morales A on: 10/18/2019 05:15 PM   Modules accepted: Orders

## 2019-10-18 NOTE — Progress Notes (Signed)
[facility] Department STI clinic/screening visit  Subjective:  Theresa Shepherd is a 33 y.o. female being seen today for an STI screening visit. The patient reports they do have symptoms.  Patient reports that they do not desire a pregnancy in the next year.   They reported they are interested in discussing contraception today. Rapid speech and poor eye contact.  Pt was speaking on telephone when provider entered the room  Patient has the following medical conditions:   Patient Active Problem List   Diagnosis Date Noted  . Marijuana use 10/18/2019  . Positive pregnancy test 03/21/2019  . Mild tetrahydrocannabinol [facility]) abuse 03/21/2019    Chief Complaint  Patient presents with  . SEXUALLY TRANSMITTED DISEASE    HPI  Patient reports vaginal dryness, internal itching x 1 month, low abdominal pain x 3-4 days  See flowsheet for further details and programmatic requirements.    The following portions of the patient's history were reviewed and updated as appropriate: allergies, current medications, past medical history, past social history, past surgical history and problem list.  Objective:  There were no vitals filed for this visit.  Physical Exam Vitals and nursing note reviewed.  Constitutional:      Appearance: Normal appearance.  HENT:     Head: Normocephalic and atraumatic.     Mouth/Throat:     Mouth: Mucous membranes are moist.     Pharynx: Oropharynx is clear. No oropharyngeal exudate or posterior oropharyngeal erythema.  Pulmonary:     Effort: Pulmonary effort is normal.  Abdominal:     General: Abdomen is flat.     Palpations: Abdomen is soft. There is no mass.     Tenderness: There is no abdominal tenderness. There is no rebound.     Comments: Soft abdomen without tenderness, grimacing, guarding, good tone  Genitourinary:    General: Normal vulva.     Exam position: Lithotomy position.     Pubic Area: No rash or pubic lice.      Labia:      Right: No rash or lesion.        Left: No rash or lesion.      Vagina: Normal. No erythema, bleeding or lesions.     Cervix: Normal.     Uterus: Normal.      Adnexa: Right adnexa normal and left adnexa normal.     Rectum: Normal.     Comments: Dry vagina with no discharge present Lymphadenopathy:     Head:     Right side of head: No preauricular or posterior auricular adenopathy.     Left side of head: No preauricular or posterior auricular adenopathy.     Cervical: No cervical adenopathy.     Upper Body:     Right upper body: No supraclavicular or axillary adenopathy.     Left upper body: No supraclavicular or axillary adenopathy.     Lower Body: No right inguinal adenopathy. No left inguinal adenopathy.  Skin:    General: Skin is warm and dry.     Findings: No rash.  Neurological:     Mental Status: She is alert and oriented to person, place, and time.      Assessment and Plan:  [name] is a 33 y.o. female presenting to the [facility] Department for STI screening  1. Marijuana use Last use yesterday - Ambulatory referral to Behavioral Health  2. Screening examination for venereal disease Treat wet mount per standing orders Immunization nurse consult -  WET PREP FOR TRICH, YEAST, CLUE - HIV/HCV [place] [facility] - Syphilis Serology, [place] [facility] - Chlamydia/Gonorrhea [place] [facility] - Gonococcus culture  3. Family planning If PT neg today and BP=wnl, may have OTC #1 I po daily to begin today; may need to return for this Please counsel on abstinance next 7 days/condom use and to do PT if no menses after completion of first pack Please check temp Referred to Milton Ferguson, LCSW per pt request- Pregnancy, urine     Return if symptoms worsen or fail to improve.  No future appointments.  Herbie Saxon, CNM

## 2019-10-18 NOTE — Progress Notes (Signed)
Here today for STD screening. Accepts bloodwork. Saydee Zolman, RN ° °

## 2019-10-23 LAB — GONOCOCCUS CULTURE

## 2019-10-25 LAB — HM HIV SCREENING LAB: HM HIV Screening: NEGATIVE

## 2019-10-25 LAB — HM HEPATITIS C SCREENING LAB: HM Hepatitis Screen: NEGATIVE

## 2020-01-25 ENCOUNTER — Inpatient Hospital Stay (HOSPITAL_COMMUNITY)
Admission: AD | Admit: 2020-01-25 | Discharge: 2020-01-25 | Disposition: A | Discharge status: Home / Self Care | Payer: Medicaid Other | Attending: Obstetrics & Gynecology | Admitting: Obstetrics & Gynecology

## 2020-01-25 ENCOUNTER — Other Ambulatory Visit: Payer: Self-pay

## 2020-01-25 DIAGNOSIS — Z3202 Encounter for pregnancy test, result negative: Secondary | ICD-10-CM | POA: Insufficient documentation

## 2020-01-25 DIAGNOSIS — N912 Amenorrhea, unspecified: Secondary | ICD-10-CM | POA: Insufficient documentation

## 2020-01-25 LAB — POCT PREGNANCY, URINE: Preg Test, Ur: NEGATIVE

## 2020-01-25 LAB — HCG, QUANTITATIVE, PREGNANCY: hCG, Beta Chain, Quant, S: <1 m[IU]/mL (ref <5)

## 2020-01-25 NOTE — MAU Provider Note (Signed)
S [name] is a 34 y.o. G30P1001 female who presents to MAU today with complaint of +HPT and LAP. Pt reports 1 +HPT and several neg. LMP was in early February.   O BP 126/74   Pulse 72   Temp 98.2 F (36.8 C)   Resp 17   Wt 65.4 kg   BMI 25.54 kg/m  Physical Exam  Nursing note and vitals reviewed. Constitutional: She is oriented to person, place, and time. She appears well-developed and well-nourished. No distress.  HENT:  Head: Normocephalic and atraumatic.  Cardiovascular: Normal rate.  Respiratory: Effort normal. No respiratory distress.  Musculoskeletal:        General: Normal range of motion.     Cervical back: Normal range of motion.  Neurological: She is alert and oriented to person, place, and time.  Psychiatric: She has a normal mood and affect.   Results for orders placed or performed during the hospital encounter of 01/25/20 (from the past 24 hour(s))  hCG, quantitative, pregnancy     Status: None   Collection Time: 01/25/20  1:31 AM  Result Value Ref Range   hCG, Beta Chain, Quant, S <1 <5 mIU/mL  Pregnancy, urine POC     Status: None   Collection Time: 01/25/20  2:43 AM  Result Value Ref Range   Preg Test, Ur NEGATIVE NEGATIVE    MDM: appears stable w/o signs of acute process. Will draw qhcg and notify pt of results by phone.   A Amenorrhea Medical screening exam complete  P Discharge from MAU in stable condition Patient given the option of transfer to [facility] for further evaluation or seek care in outpatient facility of choice List of options for follow-up given  Warning signs for worsening condition that would warrant emergency follow-up discussed Patient may return to MAU as needed for pregnancy related complaints  0319: Pt informed of negative serum HCG. She plans to f/u with an OBGYN outpt.   Donette Larry, CNM 01/25/2020 3:19 AM

## 2020-01-25 NOTE — MAU Note (Signed)
Patient states she has taken several pregnancy tests at home.  4 negative tests and 1 positive test.  Patient reports right sided lower abdominal pain that radiates to her back.  Also states she is late for her period (7 days).  Also feeling fatigued and breast tenderness.  LMP 12/13/19 but only lasted 1-1.5 days.

## 2020-05-04 DIAGNOSIS — Z419 Encounter for procedure for purposes other than remedying health state, unspecified: Secondary | ICD-10-CM | POA: Diagnosis not present

## 2020-05-18 ENCOUNTER — Other Ambulatory Visit: Payer: Self-pay

## 2020-05-18 ENCOUNTER — Encounter: Payer: Self-pay | Admitting: Physician Assistant

## 2020-05-18 ENCOUNTER — Ambulatory Visit
Admission: EM | Admit: 2020-05-18 | Discharge: 2020-05-18 | Disposition: A | Discharge status: Home / Self Care | Payer: Medicaid Other | Attending: Physician Assistant | Admitting: Physician Assistant

## 2020-05-18 DIAGNOSIS — R509 Fever, unspecified: Secondary | ICD-10-CM | POA: Diagnosis not present

## 2020-05-18 DIAGNOSIS — N3090 Cystitis, unspecified without hematuria: Secondary | ICD-10-CM | POA: Insufficient documentation

## 2020-05-18 LAB — POCT URINE PREGNANCY: Preg Test, Ur: NEGATIVE

## 2020-05-18 LAB — POCT URINALYSIS DIP (MANUAL ENTRY)
Bilirubin, UA: NEGATIVE
Glucose, UA: NEGATIVE mg/dL
Ketones, POC UA: NEGATIVE mg/dL
Nitrite, UA: POSITIVE — AB
Protein Ur, POC: 30 mg/dL — AB
Spec Grav, UA: 1.02 (ref 1.010–1.025)
Urobilinogen, UA: 1 E.U./dL
pH, UA: 6 (ref 5.0–8.0)

## 2020-05-18 MED ORDER — CIPROFLOXACIN HCL 500 MG PO TABS
500.0000 mg | ORAL_TABLET | Freq: Two times a day (BID) | ORAL | 0 refills | Status: DC
Start: 2020-05-18 — End: 2020-09-17

## 2020-05-18 NOTE — ED Provider Notes (Signed)
[name]    CSN: [id number] Arrival date & time: 05/18/20  1502      History   Chief Complaint Chief Complaint  Patient presents with  . Fatigue    HPI Theresa Shepherd is a 34 y.o. female.   34 year old female comes in for 2 day of fever, fatigue. States tmax 101 "under the covers". Did have urinary symptoms with low abdominal pain that improved after AZO use about 2-3 weeks. Now without urinary symptoms. Does have right flank pain, though states have pain to the area at baseline. Denies URI symptoms. Denies abdominal pain, nausea, vomiting, diarrhea. Denies shortness of breath, loss of taste/smell.   Last dose of antipyretic last night.      Past Medical History:  Diagnosis Date  . Asthma   . UTI (urinary tract infection)     Patient Active Problem List   Diagnosis Date Noted  . Fever 05/18/2020  . Marijuana use 10/18/2019  . Positive pregnancy test 03/21/2019  . Mild tetrahydrocannabinol [facility]) abuse 03/21/2019    Past Surgical History:  Procedure Laterality Date  . CESAREAN SECTION      OB History    Gravida  2   Para  1   Term  1   Preterm      AB      Living  1     SAB      TAB      Ectopic      Multiple      Live Births               Home Medications    Prior to Admission medications   Medication Sig Start Date End Date Taking? Authorizing Provider  ciprofloxacin (CIPRO) 500 MG tablet Take 1 tablet (500 mg total) by mouth every 12 (twelve) hours. 05/18/20   Belinda Fisher, PA-C    Family History Family History  Problem Relation Age of Onset  . Diabetes Maternal Grandmother   . Heart disease Maternal Grandmother   . Asthma Other   . Hypertension Other   . Diabetes Other   . Heart disease Paternal Grandfather   . Diabetes Mother   . Hypertension Mother   . COPD Mother   . Hypertension Father     Social History Social History   Tobacco Use  . Smoking status: Never Smoker  . Smokeless tobacco: Never Used    Substance Use Topics  . Alcohol use: Yes    Alcohol/week: 3.0 standard drinks    Types: 3 Standard drinks or equivalent per week    Comment: 2-3x/month  . Drug use: Yes    Types: Marijuana    Comment: last use 10/17/19     Allergies   Augmentin [amoxicillin-pot clavulanate]   Review of Systems Review of Systems  Reason unable to perform ROS: See HPI as above.     Physical Exam Triage Vital Signs ED Triage Vitals [05/18/20 1514]  Enc Vitals Group     BP 124/83     Pulse Rate 97     Resp 16     Temp 98 F (36.7 C)     Temp src      SpO2 97 %     Weight      Height      Head Circumference      Peak Flow      Pain Score 6     Pain Loc      Pain Edu?  Excl. in [place]?    No data found.  Updated Vital Signs BP 124/83   Pulse 97   Temp 98 F (36.7 C)   Resp 16   LMP 05/07/2020   SpO2 97%   Physical Exam Constitutional:      General: She is not in acute distress.    Appearance: She is well-developed. She is not ill-appearing, toxic-appearing or diaphoretic.  HENT:     Head: Normocephalic and atraumatic.  Eyes:     Conjunctiva/sclera: Conjunctivae normal.     Pupils: Pupils are equal, round, and reactive to light.  Cardiovascular:     Rate and Rhythm: Normal rate and regular rhythm.  Pulmonary:     Effort: Pulmonary effort is normal. No respiratory distress.     Comments: LCTAB Abdominal:     General: Bowel sounds are normal.     Palpations: Abdomen is soft.     Tenderness: There is no abdominal tenderness. There is no right CVA tenderness, left CVA tenderness, guarding or rebound.  Musculoskeletal:     Cervical back: Normal range of motion and neck supple.  Skin:    General: Skin is warm and dry.  Neurological:     Mental Status: She is alert and oriented to person, place, and time.  Psychiatric:        Behavior: Behavior normal.        Judgment: Judgment normal.      UC Treatments / Results  Labs (all labs ordered are listed, but only  abnormal results are displayed) Labs Reviewed  POCT URINALYSIS DIP (MANUAL ENTRY) - Abnormal; Notable for the following components:      Result Value   Blood, UA moderate (*)    Protein Ur, POC =30 (*)    Nitrite, UA Positive (*)    Leukocytes, UA Small (1+) (*)    All other components within normal limits  POCT URINE PREGNANCY - Normal  NOVEL CORONAVIRUS, NAA  URINE CULTURE    EKG   Radiology No results found.  Procedures Procedures (including critical care time)  Medications Ordered in UC Medications - No data to display  Initial Impression / Assessment and Plan / UC Course  I have reviewed the triage vital signs and the nursing notes.  Pertinent labs & imaging results that were available during my care of the patient were reviewed by me and considered in my medical decision making (see chart for details).    Patient requesting Covid testing due to fever, to quarantine until testing results return.  Urine positive for blood, nitrite, leukocytes.  Patient currently afebrile without antipyretics, nontoxic in appearance.  Stable vitals.  Given urine dipstick results, fever, will cover for pyelonephritis.  Allergies to PCN, unknown reaction to cephalosporins. Will start cipro. Push fluids. Return precautions given.    Final Clinical Impressions(s) / UC Diagnoses   Final diagnoses:  Fever, unspecified fever cause  Cystitis    ED Prescriptions    Medication Sig Dispense Auth. Provider   ciprofloxacin (CIPRO) 500 MG tablet Take 1 tablet (500 mg total) by mouth every 12 (twelve) hours. 14 tablet Belinda Fisher, PA-C     PDMP not reviewed this encounter.   Belinda Fisher, PA-C 05/18/20 1601

## 2020-05-18 NOTE — ED Triage Notes (Addendum)
Pt c/o fatigue and fever (101.5 at home) since yesterday, reports taking AZO 2-3 weeks ago for low abdominal pain and dysuria, but no antibiotic use  Pt works at a [employer] and requesting COVID testing

## 2020-05-18 NOTE — Discharge Instructions (Signed)
Covid pending, quarantine until testing results return. Urine positive for urinary tract infection, given fever, will cover kidney infection with ciprofloxacin. Keep hydrated, urine should be clear to pale yellow in color.  Monitor for any worsening of symptoms, abdominal pain, unable to urinate, passing out, fever not resolving, go to the ED for further evaluation.

## 2020-05-19 LAB — SARS-COV-2, NAA 2 DAY TAT

## 2020-05-19 LAB — NOVEL CORONAVIRUS, NAA: SARS-CoV-2, NAA: NOT DETECTED

## 2020-05-21 LAB — URINE CULTURE: Culture: >=100000 — AB

## 2020-05-28 DIAGNOSIS — R319 Hematuria, unspecified: Secondary | ICD-10-CM | POA: Diagnosis not present

## 2020-05-28 DIAGNOSIS — N200 Calculus of kidney: Secondary | ICD-10-CM | POA: Diagnosis not present

## 2020-05-28 DIAGNOSIS — N390 Urinary tract infection, site not specified: Secondary | ICD-10-CM | POA: Diagnosis not present

## 2020-05-28 DIAGNOSIS — R519 Headache, unspecified: Secondary | ICD-10-CM | POA: Diagnosis not present

## 2020-05-28 DIAGNOSIS — K5900 Constipation, unspecified: Secondary | ICD-10-CM | POA: Diagnosis not present

## 2020-05-28 DIAGNOSIS — N2889 Other specified disorders of kidney and ureter: Secondary | ICD-10-CM | POA: Diagnosis not present

## 2020-05-28 DIAGNOSIS — R509 Fever, unspecified: Secondary | ICD-10-CM | POA: Diagnosis not present

## 2020-05-28 DIAGNOSIS — Z20822 Contact with and (suspected) exposure to covid-19: Secondary | ICD-10-CM | POA: Diagnosis not present

## 2020-05-28 DIAGNOSIS — N3289 Other specified disorders of bladder: Secondary | ICD-10-CM | POA: Diagnosis not present

## 2020-05-28 DIAGNOSIS — Z87891 Personal history of nicotine dependence: Secondary | ICD-10-CM | POA: Diagnosis not present

## 2020-05-29 DIAGNOSIS — N2889 Other specified disorders of kidney and ureter: Secondary | ICD-10-CM | POA: Diagnosis not present

## 2020-05-29 DIAGNOSIS — N200 Calculus of kidney: Secondary | ICD-10-CM | POA: Diagnosis not present

## 2020-05-29 DIAGNOSIS — N3289 Other specified disorders of bladder: Secondary | ICD-10-CM | POA: Diagnosis not present

## 2020-06-04 DIAGNOSIS — Z419 Encounter for procedure for purposes other than remedying health state, unspecified: Secondary | ICD-10-CM | POA: Diagnosis not present

## 2020-06-29 DIAGNOSIS — O26849 Uterine size-date discrepancy, unspecified trimester: Secondary | ICD-10-CM | POA: Diagnosis not present

## 2020-06-29 DIAGNOSIS — Z3490 Encounter for supervision of normal pregnancy, unspecified, unspecified trimester: Secondary | ICD-10-CM | POA: Diagnosis not present

## 2020-06-29 DIAGNOSIS — Z113 Encounter for screening for infections with a predominantly sexual mode of transmission: Secondary | ICD-10-CM | POA: Diagnosis not present

## 2020-06-29 DIAGNOSIS — Z3480 Encounter for supervision of other normal pregnancy, unspecified trimester: Secondary | ICD-10-CM | POA: Diagnosis not present

## 2020-06-29 DIAGNOSIS — Z124 Encounter for screening for malignant neoplasm of cervix: Secondary | ICD-10-CM | POA: Diagnosis not present

## 2020-07-05 DIAGNOSIS — Z419 Encounter for procedure for purposes other than remedying health state, unspecified: Secondary | ICD-10-CM | POA: Diagnosis not present

## 2020-07-11 ENCOUNTER — Ambulatory Visit
Admission: EM | Admit: 2020-07-11 | Discharge: 2020-07-11 | Disposition: A | Discharge status: Home / Self Care | Payer: Medicaid Other | Attending: Family Medicine | Admitting: Family Medicine

## 2020-07-11 DIAGNOSIS — H01139 Eczematous dermatitis of unspecified eye, unspecified eyelid: Secondary | ICD-10-CM

## 2020-07-11 MED ORDER — TRIAMCINOLONE 0.1 % CREAM:EUCERIN CREAM 1:1: 1.0000 "application " | TOPICAL_CREAM | Freq: Two times a day (BID) | CUTANEOUS | 0 refills | Status: DC

## 2020-07-11 NOTE — ED Provider Notes (Signed)
Theresa Shepherd    CSN: [id number] Arrival date & time: 07/11/20  1209      History   Chief Complaint Chief Complaint  Patient presents with   Rash    HPI Theresa Shepherd is a 34 y.o. female.   Patient complains of rash.  She is pregnant had similar rash when she was pregnant with her son several years ago.  Also has a history of eczema.  HPI  Past Medical History:  Diagnosis Date   Asthma    UTI (urinary tract infection)     Patient Active Problem List   Diagnosis Date Noted   Fever 05/18/2020   Marijuana use 10/18/2019   Positive pregnancy test 03/21/2019   Mild tetrahydrocannabinol (THC) abuse 03/21/2019    Past Surgical History:  Procedure Laterality Date   CESAREAN SECTION      OB History    Gravida  3   Para  1   Term  1   Preterm      AB      Living  1     SAB      TAB      Ectopic      Multiple      Live Births               Home Medications    Prior to Admission medications   Medication Sig Start Date End Date Taking? Authorizing Provider  ciprofloxacin (CIPRO) 500 MG tablet Take 1 tablet (500 mg total) by mouth every 12 (twelve) hours. 05/18/20   Belinda Fisher, PA-C    Family History Family History  Problem Relation Age of Onset   Diabetes Maternal Grandmother    Heart disease Maternal Grandmother    Asthma Other    Hypertension Other    Diabetes Other    Heart disease Paternal Grandfather    Diabetes Mother    Hypertension Mother    COPD Mother    Hypertension Father     Social History Social History   Tobacco Use   Smoking status: Never Smoker   Smokeless tobacco: Never Used  Substance Use Topics   Alcohol use: Yes    Alcohol/week: 3.0 standard drinks    Types: 3 Standard drinks or equivalent per week    Comment: 2-3x/month   Drug use: Yes    Types: Marijuana    Comment: last use 10/17/19     Allergies   Augmentin [amoxicillin-pot clavulanate]   Review of  Systems Review of Systems  Skin: Positive for rash.  All other systems reviewed and are negative.    Physical Exam Triage Vital Signs ED Triage Vitals  Enc Vitals Group     BP 07/11/20 1242 118/77     Pulse Rate 07/11/20 1242 77     Resp 07/11/20 1242 19     Temp 07/11/20 1242 98.2 F (36.8 C)     Temp src --      SpO2 07/11/20 1242 99 %     Weight --      Height --      Head Circumference --      Peak Flow --      Pain Score 07/11/20 1240 0     Pain Loc --      Pain Edu? --      Excl. in [place]? --    No data found.  Updated Vital Signs BP 118/77    Pulse 77    Temp 98.2 F (36.8  C)    Resp 19    LMP 05/07/2020    SpO2 99%   Visual Acuity Right Eye Distance:   Left Eye Distance:   Bilateral Distance:    Right Eye Near:   Left Eye Near:    Bilateral Near:     Physical Exam Vitals and nursing note reviewed.  Constitutional:      Appearance: Normal appearance.  HENT:     Head: Normocephalic.  Cardiovascular:     Rate and Rhythm: Normal rate and regular rhythm.  Pulmonary:     Effort: Pulmonary effort is normal.     Breath sounds: Normal breath sounds.  Skin:    Comments: Generalized rash most consistent with areas of depigmentation.  I suspect this is hormonal and related to her pregnancy.  This does not look like eczema  Neurological:     Mental Status: She is alert.      UC Treatments / Results  Labs (all labs ordered are listed, but only abnormal results are displayed) Labs Reviewed - No data to display  EKG   Radiology No results found.  Procedures Procedures (including critical Shepherd time)  Medications Ordered in UC Medications - No data to display  Initial Impression / Assessment and Plan / UC Course  I have reviewed the triage vital signs and the nursing notes.  Pertinent labs & imaging results that were available during my Shepherd of the patient were reviewed by me and considered in my medical decision making (see chart for details).      Nonspecific dermatitis.  Will treat with topical steroid cream Final Clinical Impressions(s) / UC Diagnoses   Final diagnoses:  None   Discharge Instructions   None    ED Prescriptions    None     PDMP not reviewed this encounter.   Frederica Kuster, MD 07/11/20 1251

## 2020-07-11 NOTE — ED Triage Notes (Addendum)
Pt presents with complaints of rash that is on her legs, abdomen, and right eye that is patchy and itchy. Pt is [redacted] weeks pregnant, reports this happened with her last pregnancy.

## 2020-07-31 DIAGNOSIS — Z113 Encounter for screening for infections with a predominantly sexual mode of transmission: Secondary | ICD-10-CM | POA: Diagnosis not present

## 2020-07-31 DIAGNOSIS — O418X99 Other specified disorders of amniotic fluid and membranes, unspecified trimester, other fetus: Secondary | ICD-10-CM | POA: Diagnosis not present

## 2020-07-31 DIAGNOSIS — Z331 Pregnant state, incidental: Secondary | ICD-10-CM | POA: Diagnosis not present

## 2020-07-31 DIAGNOSIS — Z315 Encounter for genetic counseling: Secondary | ICD-10-CM | POA: Diagnosis not present

## 2020-07-31 DIAGNOSIS — Z3491 Encounter for supervision of normal pregnancy, unspecified, first trimester: Secondary | ICD-10-CM | POA: Diagnosis not present

## 2020-07-31 LAB — OB RESULTS CONSOLE HEPATITIS B SURFACE ANTIGEN: Hepatitis B Surface Ag: NEGATIVE

## 2020-07-31 LAB — OB RESULTS CONSOLE RPR: RPR: NONREACTIVE

## 2020-07-31 LAB — OB RESULTS CONSOLE ABO/RH: RH Type: POSITIVE

## 2020-07-31 LAB — OB RESULTS CONSOLE ANTIBODY SCREEN: Antibody Screen: NEGATIVE

## 2020-07-31 LAB — OB RESULTS CONSOLE RUBELLA ANTIBODY, IGM: Rubella: IMMUNE

## 2020-07-31 LAB — OB RESULTS CONSOLE GC/CHLAMYDIA
Chlamydia: NEGATIVE
Gonorrhea: NEGATIVE

## 2020-07-31 LAB — OB RESULTS CONSOLE HIV ANTIBODY (ROUTINE TESTING): HIV: NONREACTIVE

## 2020-08-04 DIAGNOSIS — Z419 Encounter for procedure for purposes other than remedying health state, unspecified: Secondary | ICD-10-CM | POA: Diagnosis not present

## 2020-08-29 DIAGNOSIS — Z87442 Personal history of urinary calculi: Secondary | ICD-10-CM | POA: Diagnosis not present

## 2020-08-29 DIAGNOSIS — Z3491 Encounter for supervision of normal pregnancy, unspecified, first trimester: Secondary | ICD-10-CM | POA: Diagnosis not present

## 2020-08-29 DIAGNOSIS — O418X99 Other specified disorders of amniotic fluid and membranes, unspecified trimester, other fetus: Secondary | ICD-10-CM | POA: Diagnosis not present

## 2020-08-29 DIAGNOSIS — Z3A16 16 weeks gestation of pregnancy: Secondary | ICD-10-CM | POA: Diagnosis not present

## 2020-08-29 DIAGNOSIS — Z363 Encounter for antenatal screening for malformations: Secondary | ICD-10-CM | POA: Diagnosis not present

## 2020-08-29 DIAGNOSIS — Z23 Encounter for immunization: Secondary | ICD-10-CM | POA: Diagnosis not present

## 2020-08-29 DIAGNOSIS — Z331 Pregnant state, incidental: Secondary | ICD-10-CM | POA: Diagnosis not present

## 2020-08-29 DIAGNOSIS — Z9889 Other specified postprocedural states: Secondary | ICD-10-CM | POA: Diagnosis not present

## 2020-09-04 DIAGNOSIS — Z419 Encounter for procedure for purposes other than remedying health state, unspecified: Secondary | ICD-10-CM | POA: Diagnosis not present

## 2020-09-17 ENCOUNTER — Inpatient Hospital Stay (HOSPITAL_COMMUNITY)
Admission: AD | Admit: 2020-09-17 | Discharge: 2020-09-17 | Disposition: A | Discharge status: Home / Self Care | Payer: Medicaid Other | Attending: Obstetrics and Gynecology | Admitting: Obstetrics and Gynecology

## 2020-09-17 ENCOUNTER — Other Ambulatory Visit: Payer: Self-pay

## 2020-09-17 ENCOUNTER — Inpatient Hospital Stay (HOSPITAL_COMMUNITY): Payer: Medicaid Other

## 2020-09-17 ENCOUNTER — Encounter (HOSPITAL_COMMUNITY): Payer: Self-pay | Admitting: Obstetrics and Gynecology

## 2020-09-17 DIAGNOSIS — O99891 Other specified diseases and conditions complicating pregnancy: Secondary | ICD-10-CM | POA: Diagnosis not present

## 2020-09-17 DIAGNOSIS — Z3A19 19 weeks gestation of pregnancy: Secondary | ICD-10-CM

## 2020-09-17 DIAGNOSIS — N200 Calculus of kidney: Secondary | ICD-10-CM

## 2020-09-17 DIAGNOSIS — O26832 Pregnancy related renal disease, second trimester: Secondary | ICD-10-CM | POA: Diagnosis not present

## 2020-09-17 DIAGNOSIS — O26892 Other specified pregnancy related conditions, second trimester: Secondary | ICD-10-CM

## 2020-09-17 DIAGNOSIS — R109 Unspecified abdominal pain: Secondary | ICD-10-CM | POA: Diagnosis not present

## 2020-09-17 DIAGNOSIS — R319 Hematuria, unspecified: Secondary | ICD-10-CM | POA: Diagnosis not present

## 2020-09-17 DIAGNOSIS — N132 Hydronephrosis with renal and ureteral calculous obstruction: Secondary | ICD-10-CM | POA: Diagnosis not present

## 2020-09-17 HISTORY — DX: Unspecified infectious disease: B99.9

## 2020-09-17 HISTORY — DX: Calculus of kidney: N20.0

## 2020-09-17 HISTORY — DX: Dermatitis, unspecified: L30.9

## 2020-09-17 LAB — CBC WITH DIFFERENTIAL/PLATELET
Abs Immature Granulocytes: 0.1 10*3/uL — ABNORMAL HIGH (ref 0.00–0.07)
Basophils Absolute: 0 10*3/uL (ref 0.0–0.1)
Basophils Relative: 0 %
Eosinophils Absolute: 0.1 10*3/uL (ref 0.0–0.5)
Eosinophils Relative: 1 %
HCT: 33.7 % — ABNORMAL LOW (ref 36.0–46.0)
Hemoglobin: 11.7 g/dL — ABNORMAL LOW (ref 12.0–15.0)
Immature Granulocytes: 1 %
Lymphocytes Relative: 15 %
Lymphs Abs: 1.8 10*3/uL (ref 0.7–4.0)
MCH: 31.5 pg (ref 26.0–34.0)
MCHC: 34.7 g/dL (ref 30.0–36.0)
MCV: 90.6 fL (ref 80.0–100.0)
Monocytes Absolute: 0.9 10*3/uL (ref 0.1–1.0)
Monocytes Relative: 7 %
Neutro Abs: 9.5 10*3/uL — ABNORMAL HIGH (ref 1.7–7.7)
Neutrophils Relative %: 76 %
Platelets: 216 10*3/uL (ref 150–400)
RBC: 3.72 MIL/uL — ABNORMAL LOW (ref 3.87–5.11)
RDW: 12.3 % (ref 11.5–15.5)
WBC: 12.5 10*3/uL — ABNORMAL HIGH (ref 4.0–10.5)
nRBC: 0 % (ref 0.0–0.2)

## 2020-09-17 LAB — URINALYSIS, ROUTINE W REFLEX MICROSCOPIC
Bacteria, UA: NONE SEEN
Bilirubin Urine: NEGATIVE
Glucose, UA: NEGATIVE mg/dL
Ketones, ur: NEGATIVE mg/dL
Leukocytes,Ua: NEGATIVE
Nitrite: NEGATIVE
Protein, ur: NEGATIVE mg/dL
RBC / HPF: >50 RBC/hpf — ABNORMAL HIGH (ref 0–5)
Specific Gravity, Urine: 1.018 (ref 1.005–1.030)
pH: 5 (ref 5.0–8.0)

## 2020-09-17 LAB — WET PREP, GENITAL
Sperm: NONE SEEN
Trich, Wet Prep: NONE SEEN
Yeast Wet Prep HPF POC: NONE SEEN

## 2020-09-17 LAB — COMPREHENSIVE METABOLIC PANEL
ALT: 18 U/L (ref 0–44)
AST: 13 U/L — ABNORMAL LOW (ref 15–41)
Albumin: 3.4 g/dL — ABNORMAL LOW (ref 3.5–5.0)
Alkaline Phosphatase: 52 U/L (ref 38–126)
Anion gap: 8 (ref 5–15)
BUN: 8 mg/dL (ref 6–20)
CO2: 23 mmol/L (ref 22–32)
Calcium: 8.9 mg/dL (ref 8.9–10.3)
Chloride: 106 mmol/L (ref 98–111)
Creatinine, Ser: 0.57 mg/dL (ref 0.44–1.00)
GFR, Estimated: >60 mL/min (ref >60)
Glucose, Bld: 77 mg/dL (ref 70–99)
Potassium: 3.9 mmol/L (ref 3.5–5.1)
Sodium: 137 mmol/L (ref 135–145)
Total Bilirubin: 0.5 mg/dL (ref 0.3–1.2)
Total Protein: 6.5 g/dL (ref 6.5–8.1)

## 2020-09-17 MED ORDER — OXYCODONE-ACETAMINOPHEN 5-325 MG PO TABS: 1.0000 | ORAL_TABLET | ORAL | 0 refills | Status: DC | PRN

## 2020-09-17 MED ORDER — KETOROLAC TROMETHAMINE 60 MG/2ML IM SOLN
60.0000 mg | Freq: Once | INTRAMUSCULAR | Status: AC
  Administered 2020-09-17: 60 mg via INTRAMUSCULAR
  Filled 2020-09-17: qty 2

## 2020-09-17 MED ORDER — TAMSULOSIN HCL 0.4 MG PO CAPS: 0.4000 mg | ORAL_CAPSULE | Freq: Every day | ORAL | 0 refills | Status: DC

## 2020-09-17 NOTE — Discharge Instructions (Signed)

## 2020-09-17 NOTE — MAU Note (Signed)
Hx of UTI, felt like this before. No fever, denies back pain, has also been constipated.

## 2020-09-17 NOTE — MAU Note (Signed)
Theresa Shepherd is a 34 y.o. at [redacted]w[redacted]d here in MAU reporting: yesterday started feeling a lot of vaginal/pelvic pressure. States she might be constipated- had a BM yesterday and today but it was small. Having some increased frequency.  Onset of complaint: yesterday  Pain score: 8/10  Vitals:   09/17/20 1536  BP: 117/71  Pulse: 88  Resp: 16  Temp: 98.3 F (36.8 C)  SpO2: 98%     FHT: 158  Lab orders placed from triage: UA

## 2020-09-17 NOTE — MAU Provider Note (Signed)
History     CSN: [id number]  Arrival date and time: 09/17/20 1457   First Provider Initiated Contact with Patient 09/17/20 1615      Chief Complaint  Patient presents with  . Pelvic Pain  . Urinary Frequency   HPI   Ms.Theresa Shepherd is a 34 y.o. female G3P1011 @ [redacted]w[redacted]d here In MAU with vaginal /pelvic pressure. The symptoms started yesterday. She feels the pressure in her vagina and rectum. The pressure has been constant. She has had constipation and has not had a big normal BM in a week. She also reports urinary frequency and history of UTI. She has a history of kidney stones and is unsure whether this feels the same. She has not vaginal bleeding. No fever. Some nausea, however no vomiting.   OB History    Gravida  3   Para  1   Term  1   Preterm      AB  1   Living  1     SAB      TAB  1   Ectopic      Multiple      Live Births  1           Past Medical History:  Diagnosis Date  . Asthma    as child  . Eczema   . Infection    UTI  . Kidney stones   . UTI (urinary tract infection)     Past Surgical History:  Procedure Laterality Date  . CESAREAN SECTION    . INDUCED ABORTION      Family History  Problem Relation Age of Onset  . Diabetes Maternal Grandmother   . Heart disease Maternal Grandmother   . Asthma Other   . Hypertension Other   . Diabetes Other   . Heart disease Paternal Grandfather   . Diabetes Mother   . Hypertension Mother   . COPD Mother   . Hypertension Father     Social History   Tobacco Use  . Smoking status: Never Smoker  . Smokeless tobacco: Never Used  Vaping Use  . Vaping Use: Never used  Substance Use Topics  . Alcohol use: Not Currently    Alcohol/week: 3.0 standard drinks    Types: 3 Standard drinks or equivalent per week    Comment: 2-3x/month  . Drug use: Not Currently    Types: Marijuana    Comment: last use 10/17/19    Allergies:  Allergies  Allergen Reactions  . Augmentin [Amoxicillin-Pot  Clavulanate] Hives    Medications Prior to Admission  Medication Sig Dispense Refill Last Dose  . diphenhydramine-acetaminophen (TYLENOL PM) 25-500 MG TABS tablet Take 1 tablet by mouth at bedtime as needed.   Past Week at Unknown time  . Prenatal Vit-Fe Fumarate-FA (PRENATAL VITAMINS PO) Take by mouth.   09/16/2020 at Unknown time  . Triamcinolone Acetonide (TRIAMCINOLONE 0.1 % CREAM : EUCERIN) CREA Apply 1 application topically 2 (two) times daily. 30 each 0 Past Week at Unknown time  . ciprofloxacin (CIPRO) 500 MG tablet Take 1 tablet (500 mg total) by mouth every 12 (twelve) hours. 14 tablet 0    Results for orders placed or performed during the hospital encounter of 09/17/20 (from the past 48 hour(s))  Urinalysis, Routine w reflex microscopic Urine, Clean Catch     Status: Abnormal   Collection Time: 09/17/20  3:42 PM  Result Value Ref Range   Color, Urine YELLOW YELLOW   APPearance CLEAR CLEAR  Specific Gravity, Urine 1.018 1.005 - 1.030   pH 5.0 5.0 - 8.0   Glucose, UA NEGATIVE NEGATIVE mg/dL   Hgb urine dipstick LARGE (A) NEGATIVE   Bilirubin Urine NEGATIVE NEGATIVE   Ketones, ur NEGATIVE NEGATIVE mg/dL   Protein, ur NEGATIVE NEGATIVE mg/dL   Nitrite NEGATIVE NEGATIVE   Leukocytes,Ua NEGATIVE NEGATIVE   RBC / HPF >50 (H) 0 - 5 RBC/hpf   WBC, UA 0-5 0 - 5 WBC/hpf   Bacteria, UA NONE SEEN NONE SEEN   Squamous Epithelial / LPF 0-5 0 - 5   Mucus PRESENT     Comment: Performed at [facility], [address], [place], [place] [address]  Wet prep, genital     Status: Abnormal   Collection Time: 09/17/20  4:45 PM   Specimen: Vaginal  Result Value Ref Range   Yeast Wet Prep HPF POC NONE SEEN NONE SEEN   Trich, Wet Prep NONE SEEN NONE SEEN   Clue Cells Wet Prep HPF POC PRESENT (A) NONE SEEN   WBC, Wet Prep HPF POC FEW (A) NONE SEEN   Sperm NONE SEEN     Comment: Performed at [facility], [address], [place], [place] [address]  CBC with Differential/Platelet      Status: Abnormal   Collection Time: 09/17/20  4:49 PM  Result Value Ref Range   WBC 12.5 (H) 4.0 - 10.5 K/uL   RBC 3.72 (L) 3.87 - 5.11 MIL/uL   Hemoglobin 11.7 (L) 12.0 - 15.0 g/dL   HCT 74.1 (L) 36 - 46 %   MCV 90.6 80.0 - 100.0 fL   MCH 31.5 26.0 - 34.0 pg   MCHC 34.7 30.0 - 36.0 g/dL   RDW 28.7 86.7 - 67.2 %   Platelets 216 150 - 400 K/uL   nRBC 0.0 0.0 - 0.2 %   Neutrophils Relative % 76 %   Neutro Abs 9.5 (H) 1.7 - 7.7 K/uL   Lymphocytes Relative 15 %   Lymphs Abs 1.8 0.7 - 4.0 K/uL   Monocytes Relative 7 %   Monocytes Absolute 0.9 0.1 - 1.0 K/uL   Eosinophils Relative 1 %   Eosinophils Absolute 0.1 0.0 - 0.5 K/uL   Basophils Relative 0 %   Basophils Absolute 0.0 0.0 - 0.1 K/uL   Immature Granulocytes 1 %   Abs Immature Granulocytes 0.10 (H) 0.00 - 0.07 K/uL    Comment: Performed at [facility], [address], [place], [place] [address]  Comprehensive metabolic panel     Status: Abnormal   Collection Time: 09/17/20  4:49 PM  Result Value Ref Range   Sodium 137 135 - 145 mmol/L   Potassium 3.9 3.5 - 5.1 mmol/L   Chloride 106 98 - 111 mmol/L   CO2 23 22 - 32 mmol/L   Glucose, Bld 77 70 - 99 mg/dL    Comment: Glucose reference range applies only to samples taken after fasting for at least 8 hours.   BUN 8 6 - 20 mg/dL   Creatinine, Ser 9.62 0.44 - 1.00 mg/dL   Calcium 8.9 8.9 - 83.6 mg/dL   Total Protein 6.5 6.5 - 8.1 g/dL   Albumin 3.4 (L) 3.5 - 5.0 g/dL   AST 13 (L) 15 - 41 U/L   ALT 18 0 - 44 U/L   Alkaline Phosphatase 52 38 - 126 U/L   Total Bilirubin 0.5 0.3 - 1.2 mg/dL   GFR, Estimated >62 >94 mL/min  Comment: (NOTE) Calculated using the CKD-EPI Creatinine Equation (2021)    Anion gap 8 5 - 15    Comment: Performed at [facility], [address], [place], [place] [address]   US RENAL  Result Date: 09/17/2020 CLINICAL DATA:  Nineteen weeks pregnant with hematuria EXAM: RENAL / URINARY TRACT ULTRASOUND COMPLETE COMPARISON:  May 29, 2020  FINDINGS: Right Kidney: Renal measurements: 10.8 x 5.3 x 7.3 cm = volume: 217 mL. Echogenicity is within normal limits. There is mild to moderate hydronephrosis. There are several echogenic foci with twinkle artifact consistent with nephrolithiasis. There is 1 within the inferior pole which likely corresponds to a stone seen on prior CT and measures 12 mm. There is a possible additional stone in the superior pole which measures 4 mm. Proximal ureter is mildly dilated. Left Kidney: Renal measurements: 7.0 x 5.7 x 6.5 cm = volume: 192 mL. There is mild pelviectasis. There are multiple echogenic foci with twinkle artifact consistent with nephrolithiasis, the largest of which measures 4 mm. They are seen in all poles of the kidney. Bladder: Appears normal for degree of bladder distention. No definitive obstructing urolithiasis is visualized. Other: None. IMPRESSION: 1. There is mild-to-moderate RIGHT hydroureteronephrosis and mild LEFT pelviectasis. This is nonspecific and could be due to gravid state. 2. Multiple nephrolithiasis are visualized bilaterally. No definitive obstructing urolithiasis visualized. Electronically Signed   By: Meda Klinefelter MD   On: 09/17/2020 18:49   Review of Systems  Genitourinary: Positive for frequency and pelvic pain. Negative for dysuria and vaginal bleeding.   Physical Exam   Blood pressure 121/80, pulse 82, temperature 98.3 F (36.8 C), temperature source Oral, resp. rate 16, height 5\' 3"  (1.6 m), weight 71.7 kg, last menstrual period 05/07/2020, SpO2 98 %.  Physical Exam Constitutional:      General: She is not in acute distress.    Appearance: Normal appearance. She is not toxic-appearing.  HENT:     Head: Normocephalic.  Abdominal:     Tenderness: There is generalized abdominal tenderness. There is no guarding or rebound.  Genitourinary:    Comments: Wet prep and GC collected  Cervix closed, thick, posterior.  Exam by 07/08/2020, NP  Skin:    General:  Skin is warm.  Neurological:     Mental Status: She is alert and oriented to person, place, and time.  Psychiatric:        Behavior: Behavior normal.    MAU Course  Procedures  None  MDM  CBC & CMP Wet prep shows clue cells, patient is asymptomatic Renal Venia Carbon shows multiple stones Toradol given 60 mg IM: pain now 0/10 Urine culture pending  Reviewed patient with Dr. [place]   Assessment and Plan   A:  1. Nephrolithiasis   2. Hematuria   3. Abdominal pain in pregnancy, second trimester     P:  Discharge home in stable condition Follow [facility] with [facility]  [facility] info given: call for f/u Rx: Percocet & Flomax Increase oral fluid intake.  [facility], NP 09/17/2020 7:52 PM

## 2020-09-18 LAB — GC/CHLAMYDIA PROBE AMP (~~LOC~~) NOT AT ARMC
Chlamydia: NEGATIVE
Comment: NEGATIVE
Comment: NORMAL
Neisseria Gonorrhea: NEGATIVE

## 2020-09-18 LAB — CULTURE, OB URINE: Culture: 10000 — AB

## 2020-09-19 ENCOUNTER — Telehealth: Payer: Self-pay | Admitting: Women's Health

## 2020-09-19 ENCOUNTER — Encounter: Payer: Self-pay | Admitting: Women's Health

## 2020-09-19 DIAGNOSIS — R8271 Bacteriuria: Secondary | ICD-10-CM

## 2020-09-19 MED ORDER — CEPHALEXIN 500 MG PO CAPS: 500.0000 mg | ORAL_CAPSULE | Freq: Four times a day (QID) | ORAL | 0 refills | Status: AC

## 2020-09-19 NOTE — Telephone Encounter (Signed)
Called patient to inform of +GBS in urine and need for treatment at this time. Patient identified by two identifiers. Patient reports only allergy is to Augmentin, which results in hives when she takes this medication. Patient has taken Keflex in the past without issue. Patient reports she has been prescribed Percocet, but she is not taking this medication. Patient reports she is taking Flomax. Patient states that she thinks she has a UTI because in the last 24 hours she has started experiencing burning with urination. Patient questions asked and answered and patient verbalizes understanding.  Theresa Ponto, NP  6:01 PM 09/19/2020

## 2020-09-26 DIAGNOSIS — Z331 Pregnant state, incidental: Secondary | ICD-10-CM | POA: Diagnosis not present

## 2020-09-26 DIAGNOSIS — Z3492 Encounter for supervision of normal pregnancy, unspecified, second trimester: Secondary | ICD-10-CM | POA: Diagnosis not present

## 2020-09-26 DIAGNOSIS — Z363 Encounter for antenatal screening for malformations: Secondary | ICD-10-CM | POA: Diagnosis not present

## 2020-09-26 DIAGNOSIS — Z3A20 20 weeks gestation of pregnancy: Secondary | ICD-10-CM | POA: Diagnosis not present

## 2020-09-26 DIAGNOSIS — Z9889 Other specified postprocedural states: Secondary | ICD-10-CM | POA: Diagnosis not present

## 2020-09-26 DIAGNOSIS — N200 Calculus of kidney: Secondary | ICD-10-CM | POA: Diagnosis not present

## 2020-09-26 DIAGNOSIS — B373 Candidiasis of vulva and vagina: Secondary | ICD-10-CM | POA: Diagnosis not present

## 2020-09-26 DIAGNOSIS — Z3686 Encounter for antenatal screening for cervical length: Secondary | ICD-10-CM | POA: Diagnosis not present

## 2020-10-03 DIAGNOSIS — N390 Urinary tract infection, site not specified: Secondary | ICD-10-CM | POA: Diagnosis not present

## 2020-10-03 DIAGNOSIS — L309 Dermatitis, unspecified: Secondary | ICD-10-CM | POA: Diagnosis not present

## 2020-10-03 DIAGNOSIS — N898 Other specified noninflammatory disorders of vagina: Secondary | ICD-10-CM | POA: Diagnosis not present

## 2020-10-03 DIAGNOSIS — N760 Acute vaginitis: Secondary | ICD-10-CM | POA: Diagnosis not present

## 2020-10-04 DIAGNOSIS — Z419 Encounter for procedure for purposes other than remedying health state, unspecified: Secondary | ICD-10-CM | POA: Diagnosis not present

## 2020-10-13 DIAGNOSIS — Z87440 Personal history of urinary (tract) infections: Secondary | ICD-10-CM | POA: Diagnosis not present

## 2020-10-16 ENCOUNTER — Other Ambulatory Visit: Payer: Self-pay

## 2020-10-16 ENCOUNTER — Encounter: Payer: Self-pay | Admitting: Urology

## 2020-10-16 ENCOUNTER — Ambulatory Visit (INDEPENDENT_AMBULATORY_CARE_PROVIDER_SITE_OTHER): Payer: Medicaid Other | Admitting: Urology

## 2020-10-16 VITALS — BP 113/70 | HR 101 | Temp 97.8°F | Wt 162.0 lb

## 2020-10-16 DIAGNOSIS — N200 Calculus of kidney: Secondary | ICD-10-CM | POA: Diagnosis not present

## 2020-10-16 LAB — URINALYSIS, ROUTINE W REFLEX MICROSCOPIC
Bilirubin, UA: NEGATIVE
Glucose, UA: NEGATIVE
Ketones, UA: NEGATIVE
Leukocytes,UA: NEGATIVE
Nitrite, UA: NEGATIVE
Protein,UA: NEGATIVE
Specific Gravity, UA: 1.02 (ref 1.005–1.030)
Urobilinogen, Ur: 1 mg/dL (ref 0.2–1.0)
pH, UA: 7 (ref 5.0–7.5)

## 2020-10-16 LAB — MICROSCOPIC EXAMINATION: Renal Epithel, UA: NONE SEEN /hpf

## 2020-10-16 MED ORDER — TAMSULOSIN HCL 0.4 MG PO CAPS: 0.4000 mg | ORAL_CAPSULE | Freq: Every day | ORAL | 0 refills | Status: DC

## 2020-10-16 MED ORDER — HYDROCODONE-ACETAMINOPHEN 5-325 MG PO TABS: 1.0000 | ORAL_TABLET | Freq: Four times a day (QID) | ORAL | 0 refills | Status: DC | PRN

## 2020-10-16 NOTE — Progress Notes (Signed)
Pt is [redacted] weeks pregnant.  Reports treated with two round of cephalexin and IM rocephin. Pt reports she is still having dysuria and pelvic pressure. Reports positive GBS in urine   Urological Symptom Review  Patient is experiencing the following symptoms: Frequent urination Burning/pain with urination Stream starts and stops Urinary tract infection   Review of Systems  Gastrointestinal (upper)  : Nausea  Gastrointestinal (lower) : Constipation  Constitutional : Negative for symptoms  Skin: Negative for skin symptoms  Eyes: Negative for eye symptoms   Ear/Nose/Throat : Negative for Ear/Nose/Throat symptoms  Hematologic/Lymphatic: Negative for Hematologic/Lymphatic symptoms  Cardiovascular : Negative for cardiovascular symptoms  Respiratory : Negative for respiratory symptoms  Endocrine: Negative for endocrine symptoms  Musculoskeletal: Negative for musculoskeletal symptoms  Neurological: Negative for neurological symptoms  Psychologic: Negative for psychiatric symptoms

## 2020-10-16 NOTE — Patient Instructions (Signed)

## 2020-10-16 NOTE — Progress Notes (Signed)
10/16/2020 1:50 PM   Theresa GALIANO 1986/04/21 [id number]  Referring provider: No referring provider defined for this encounter.  nephrolithiasis  HPI: Theresa Shepherd is a 33yo here for evaluation of nephrolithiasis. Starting 4 weeks ago she developed abdominal pain and gross hematuria. She presented to the ER and underwent renal US which showed bilateral renal calculi and mild right hydronephrosis. She does not have flank pain. She has suprapubic pressure and pain at the end of urination. She was told she had renal calculi in July 2021. She does not recall ever passing any calculi. She was having flank pain in JUly 2021. She has urinary frequency, urgency, dysuria. UA today is normal   PMH: Past Medical History:  Diagnosis Date  . Asthma    as child  . Eczema   . Infection    UTI  . Kidney stones   . UTI (urinary tract infection)     Surgical History: Past Surgical History:  Procedure Laterality Date  . CESAREAN SECTION    . INDUCED ABORTION      Home Medications:  Allergies as of 10/16/2020      Reactions   Augmentin [amoxicillin-pot Clavulanate] Hives      Medication List       Accurate as of October 16, 2020  1:50 PM. If you have any questions, ask your nurse or doctor.        clotrimazole-betamethasone cream Commonly known as: LOTRISONE Apply topically.   diphenhydramine-acetaminophen 25-500 MG Tabs tablet Commonly known as: TYLENOL PM Take 1 tablet by mouth at bedtime as needed.   oxyCODONE-acetaminophen 5-325 MG tablet Commonly known as: Percocet Take 1-2 tablets by mouth every 4 (four) hours as needed for severe pain.   PRENATAL VITAMINS PO Take by mouth.   tamsulosin 0.4 MG Caps capsule Commonly known as: Flomax Take 1 capsule (0.4 mg total) by mouth daily.   triamcinolone 0.1 % cream : eucerin Crea Apply 1 application topically 2 (two) times daily.       Allergies:  Allergies  Allergen Reactions  . Augmentin [Amoxicillin-Pot  Clavulanate] Hives    Family History: Family History  Problem Relation Age of Onset  . Diabetes Maternal Grandmother   . Heart disease Maternal Grandmother   . Asthma Other   . Hypertension Other   . Diabetes Other   . Heart disease Paternal Grandfather   . Diabetes Mother   . Hypertension Mother   . COPD Mother   . Hypertension Father     Social History:  reports that she has never smoked. She has never used smokeless tobacco. She reports previous alcohol use of about 3.0 standard drinks of alcohol per week. She reports previous drug use. Drug: Marijuana.  ROS: All other review of systems were reviewed and are negative except what is noted above in HPI  Physical Exam: BP 113/70   Pulse (!) 101   Temp 97.8 F (36.6 C)   Wt 162 lb (73.5 kg)   LMP 05/07/2020   BMI 28.70 kg/m   Constitutional:  Alert and oriented, No acute distress. HEENT: [place] AT, moist mucus membranes.  Trachea midline, no masses. Cardiovascular: No clubbing, cyanosis, or edema. Respiratory: Normal respiratory effort, no increased work of breathing. GI: Abdomen is soft, nontender, nondistended, no abdominal masses GU: No CVA tenderness.  Lymph: No cervical or inguinal lymphadenopathy. Skin: No rashes, bruises or suspicious lesions. Neurologic: Grossly intact, no focal deficits, moving all 4 extremities. Psychiatric: Normal mood and affect.  Laboratory Data: Lab  Results  Component Value Date   WBC 12.5 (H) 09/17/2020   HGB 11.7 (L) 09/17/2020   HCT 33.7 (L) 09/17/2020   MCV 90.6 09/17/2020   PLT 216 09/17/2020    Lab Results  Component Value Date   CREATININE 0.57 09/17/2020    No results found for: PSA  No results found for: TESTOSTERONE  No results found for: HGBA1C  Urinalysis    Component Value Date/Time   COLORURINE YELLOW 09/17/2020 1542   APPEARANCEUR CLEAR 09/17/2020 1542   LABSPEC 1.018 09/17/2020 1542   PHURINE 5.0 09/17/2020 1542   GLUCOSEU NEGATIVE 09/17/2020 1542    HGBUR LARGE (A) 09/17/2020 1542   BILIRUBINUR NEGATIVE 09/17/2020 1542   BILIRUBINUR negative 05/18/2020 1520   KETONESUR NEGATIVE 09/17/2020 1542   PROTEINUR NEGATIVE 09/17/2020 1542   UROBILINOGEN 1.0 05/18/2020 1520   UROBILINOGEN 0.2 09/28/2019 1151   NITRITE NEGATIVE 09/17/2020 1542   LEUKOCYTESUR NEGATIVE 09/17/2020 1542    Lab Results  Component Value Date   BACTERIA NONE SEEN 09/17/2020    Pertinent Imaging: Renal US 09/17/2020: Images reviewed and discussed with the patient.  No results found for this or any previous visit.  No results found for this or any previous visit.  No results found for this or any previous visit.  No results found for this or any previous visit.  Results for orders placed during the hospital encounter of 09/17/20  US RENAL  Narrative CLINICAL DATA:  Nineteen weeks pregnant with hematuria  EXAM: RENAL / URINARY TRACT ULTRASOUND COMPLETE  COMPARISON:  May 29, 2020  FINDINGS: Right Kidney:  Renal measurements: 10.8 x 5.3 x 7.3 cm = volume: 217 mL. Echogenicity is within normal limits. There is mild to moderate hydronephrosis. There are several echogenic foci with twinkle artifact consistent with nephrolithiasis. There is 1 within the inferior pole which likely corresponds to a stone seen on prior CT and measures 12 mm. There is a possible additional stone in the superior pole which measures 4 mm. Proximal ureter is mildly dilated.  Left Kidney:  Renal measurements: 7.0 x 5.7 x 6.5 cm = volume: 192 mL. There is mild pelviectasis. There are multiple echogenic foci with twinkle artifact consistent with nephrolithiasis, the largest of which measures 4 mm. They are seen in all poles of the kidney.  Bladder:  Appears normal for degree of bladder distention. No definitive obstructing urolithiasis is visualized.  Other:  None.  IMPRESSION: 1. There is mild-to-moderate RIGHT hydroureteronephrosis and mild LEFT pelviectasis.  This is nonspecific and could be due to gravid state. 2. Multiple nephrolithiasis are visualized bilaterally. No definitive obstructing urolithiasis visualized.   Electronically Signed By: Meda Klinefelter MD On: 09/17/2020 18:49  No results found for this or any previous visit.  No results found for this or any previous visit.  No results found for this or any previous visit.   Assessment & Plan:    1. Nephrolithiasis --We discussed the management of kidney stones. These options include observation, ureteroscopy, shockwave lithotripsy (ESWL) and percutaneous nephrolithotomy (PCNL). We discussed which options are relevant to the patient's stone(s). We discussed the natural history of kidney stones as well as the complications of untreated stones and the impact on quality of life without treatment as well as with each of the above listed treatments. We also discussed the efficacy of each treatment in its ability to clear the stone burden. With any of these management options I discussed the signs and symptoms of infection and the need for emergent  treatment should these be experienced. For each option we discussed the ability of each procedure to clear the patient of their stone burden.   For observation I described the risks which include but are not limited to silent renal damage, life-threatening infection, need for emergent surgery, failure to pass stone and pain.   For ureteroscopy I described the risks which include bleeding, infection, damage to contiguous structures, positioning injury, ureteral stricture, ureteral avulsion, ureteral injury, need for prolonged ureteral stent, inability to perform ureteroscopy, need for an interval procedure, inability to clear stone burden, stent discomfort/pain, heart attack, stroke, pulmonary embolus and the inherent risks with general anesthesia.   For shockwave lithotripsy I described the risks which include arrhythmia, kidney contusion, kidney  hemorrhage, need for transfusion, pain, inability to adequately break up stone, inability to pass stone fragments, Steinstrasse, infection associated with obstructing stones, need for alternate surgical procedure, need for repeat shockwave lithotripsy, MI, CVA, PE and the inherent risks with anesthesia/conscious sedation.   For PCNL I described the risks including positioning injury, pneumothorax, hydrothorax, need for chest tube, inability to clear stone burden, renal laceration, arterial venous fistula or malformation, need for embolization of kidney, loss of kidney or renal function, need for repeat procedure, need for prolonged nephrostomy tube, ureteral avulsion, MI, CVA, PE and the inherent risks of general anesthesia.   - The patient would like to proceed with medical expulsive therapy.  - Urinalysis, Routine w reflex microscopic   No follow-ups on file.  Wilkie Aye, MD  [facility] [place]

## 2020-10-17 ENCOUNTER — Encounter (HOSPITAL_COMMUNITY): Payer: Self-pay | Admitting: Obstetrics and Gynecology

## 2020-10-17 ENCOUNTER — Inpatient Hospital Stay (HOSPITAL_COMMUNITY)
Admission: AD | Admit: 2020-10-17 | Discharge: 2020-10-17 | Disposition: A | Discharge status: Home / Self Care | Payer: Medicaid Other | Attending: Obstetrics and Gynecology | Admitting: Obstetrics and Gynecology

## 2020-10-17 ENCOUNTER — Other Ambulatory Visit: Payer: Self-pay

## 2020-10-17 DIAGNOSIS — R102 Pelvic and perineal pain: Secondary | ICD-10-CM | POA: Insufficient documentation

## 2020-10-17 DIAGNOSIS — N200 Calculus of kidney: Secondary | ICD-10-CM

## 2020-10-17 DIAGNOSIS — Z3A23 23 weeks gestation of pregnancy: Secondary | ICD-10-CM | POA: Diagnosis not present

## 2020-10-17 DIAGNOSIS — Z880 Allergy status to penicillin: Secondary | ICD-10-CM | POA: Diagnosis not present

## 2020-10-17 DIAGNOSIS — R8290 Unspecified abnormal findings in urine: Secondary | ICD-10-CM | POA: Insufficient documentation

## 2020-10-17 DIAGNOSIS — Z881 Allergy status to other antibiotic agents status: Secondary | ICD-10-CM | POA: Insufficient documentation

## 2020-10-17 DIAGNOSIS — O26832 Pregnancy related renal disease, second trimester: Secondary | ICD-10-CM | POA: Insufficient documentation

## 2020-10-17 DIAGNOSIS — R8271 Bacteriuria: Secondary | ICD-10-CM

## 2020-10-17 DIAGNOSIS — R109 Unspecified abdominal pain: Secondary | ICD-10-CM | POA: Diagnosis present

## 2020-10-17 DIAGNOSIS — O26892 Other specified pregnancy related conditions, second trimester: Secondary | ICD-10-CM

## 2020-10-17 LAB — CBC
HCT: 30.2 % — ABNORMAL LOW (ref 36.0–46.0)
Hemoglobin: 10.4 g/dL — ABNORMAL LOW (ref 12.0–15.0)
MCH: 31.2 pg (ref 26.0–34.0)
MCHC: 34.4 g/dL (ref 30.0–36.0)
MCV: 90.7 fL (ref 80.0–100.0)
Platelets: 191 10*3/uL (ref 150–400)
RBC: 3.33 MIL/uL — ABNORMAL LOW (ref 3.87–5.11)
RDW: 12.3 % (ref 11.5–15.5)
WBC: 14.2 10*3/uL — ABNORMAL HIGH (ref 4.0–10.5)
nRBC: 0 % (ref 0.0–0.2)

## 2020-10-17 LAB — URINALYSIS, ROUTINE W REFLEX MICROSCOPIC
Bilirubin Urine: NEGATIVE
Glucose, UA: NEGATIVE mg/dL
Ketones, ur: NEGATIVE mg/dL
Leukocytes,Ua: NEGATIVE
Nitrite: POSITIVE — AB
Protein, ur: NEGATIVE mg/dL
Specific Gravity, Urine: 1.004 — ABNORMAL LOW (ref 1.005–1.030)
pH: 6 (ref 5.0–8.0)

## 2020-10-17 MED ORDER — OXYCODONE-ACETAMINOPHEN 5-325 MG PO TABS
2.0000 | ORAL_TABLET | Freq: Once | ORAL | Status: AC
  Administered 2020-10-17: 2 via ORAL
  Filled 2020-10-17: qty 2

## 2020-10-17 MED ORDER — OXYCODONE-ACETAMINOPHEN 5-325 MG PO TABS: 2.0000 | ORAL_TABLET | ORAL | 0 refills | Status: DC | PRN

## 2020-10-17 MED ORDER — ONDANSETRON HCL 4 MG PO TABS
4.0000 mg | ORAL_TABLET | Freq: Once | ORAL | Status: AC
  Administered 2020-10-17: 4 mg via ORAL
  Filled 2020-10-17: qty 1

## 2020-10-17 MED ORDER — ONDANSETRON HCL 4 MG PO TABS: 4.0000 mg | ORAL_TABLET | Freq: Three times a day (TID) | ORAL | 0 refills | Status: DC | PRN

## 2020-10-17 MED ORDER — TAMSULOSIN HCL 0.4 MG PO CAPS: 0.4000 mg | ORAL_CAPSULE | Freq: Every day | ORAL | 0 refills | Status: DC

## 2020-10-17 NOTE — MAU Provider Note (Signed)
History     CSN: [id number]  Arrival date and time: 10/17/20 1614   Event Date/Time   First Provider Initiated Contact with Patient 10/17/20 1723      Chief Complaint  Patient presents with  . Abdominal Pain   HPI   Theresa Shepherd is a 34 y.o. G1P1011 female at [redacted]w[redacted]d who presents to MAU with abdominal and pelvic pain with recent history of nephrolithiasis. Was seen in MAU on 11/14 for flank pain and found to have multiple renal stones on ultrasound. Discharged with Percocet and Flomax. Patient seen by Urology yesterday and management of nephrolithiasis was discussed. She elected for medical expulsive therapy. She now presents for increased pain compared to yesterday. Reports pain is less in back and now lower in the vagina. Feels like a stabbing pain. Has not passed any stones. Feels a lot of pain with urination and pressure. Resumed Flomax and has taken 3 days in a row. Last took Percocet around 1300 today. Endorses nausea.   OB History    Gravida  3   Para  1   Term  1   Preterm      AB  1   Living  1     SAB      IAB  1   Ectopic      Multiple      Live Births  1           Past Medical History:  Diagnosis Date  . Asthma    as child  . Eczema   . Infection    UTI  . Kidney stones   . UTI (urinary tract infection)     Past Surgical History:  Procedure Laterality Date  . CESAREAN SECTION    . INDUCED ABORTION      Family History  Problem Relation Age of Onset  . Diabetes Maternal Grandmother   . Heart disease Maternal Grandmother   . Asthma Other   . Hypertension Other   . Diabetes Other   . Heart disease Paternal Grandfather   . Diabetes Mother   . Hypertension Mother   . COPD Mother   . Hypertension Father     Social History   Tobacco Use  . Smoking status: Never Smoker  . Smokeless tobacco: Never Used  Vaping Use  . Vaping Use: Never used  Substance Use Topics  . Alcohol use: Not Currently    Alcohol/week: 3.0 standard drinks     Types: 3 Standard drinks or equivalent per week    Comment: 2-3x/month  . Drug use: Not Currently    Types: Marijuana    Comment: last use 10/17/19    Allergies:  Allergies  Allergen Reactions  . Augmentin [Amoxicillin-Pot Clavulanate] Hives    Medications Prior to Admission  Medication Sig Dispense Refill Last Dose  . diphenhydramine-acetaminophen (TYLENOL PM) 25-500 MG TABS tablet Take 1 tablet by mouth at bedtime as needed.   10/16/2020 at Unknown time  . oxyCODONE-acetaminophen (PERCOCET) 5-325 MG tablet Take 1-2 tablets by mouth every 4 (four) hours as needed for severe pain. 15 tablet 0 10/17/2020 at Unknown time  . Prenatal Vit-Fe Fumarate-FA (PRENATAL VITAMINS PO) Take by mouth.   10/17/2020 at Unknown time  . tamsulosin (FLOMAX) 0.4 MG CAPS capsule Take 1 capsule (0.4 mg total) by mouth daily after supper. 30 capsule 0 10/17/2020 at Unknown time  . Triamcinolone Acetonide (TRIAMCINOLONE 0.1 % CREAM : EUCERIN) CREA Apply 1 application topically 2 (two) times daily. 30 each  0 Past Week at Unknown time  . clotrimazole-betamethasone (LOTRISONE) cream Apply topically.     Marland Kitchen HYDROcodone-acetaminophen (NORCO) 5-325 MG tablet Take 1 tablet by mouth every 6 (six) hours as needed for moderate pain. 15 tablet 0     Review of Systems  Constitutional: Negative for chills and fever.  Respiratory: Negative for chest tightness and shortness of breath.   Cardiovascular: Negative for chest pain.  Gastrointestinal: Positive for abdominal pain and nausea. Negative for vomiting.  Genitourinary: Positive for decreased urine volume, dysuria and pelvic pain. Negative for hematuria.  Musculoskeletal: Positive for back pain.  All other systems reviewed and are negative.  Physical Exam   Blood pressure 136/82, pulse 90, temperature 97.9 F (36.6 C), temperature source Oral, resp. rate 12, last menstrual period 05/07/2020, SpO2 98 %.  Physical Exam Constitutional:      Appearance: She is  well-developed.     Comments: Appears uncomfortable but non-toxic.   HENT:     Head: Normocephalic and atraumatic.  Cardiovascular:     Rate and Rhythm: Normal rate and regular rhythm.     Heart sounds: No murmur heard.   Pulmonary:     Effort: Pulmonary effort is normal.     Breath sounds: Normal breath sounds.  Abdominal:     Tenderness: There is abdominal tenderness in the suprapubic area. There is no right CVA tenderness or left CVA tenderness.  Skin:    General: Skin is warm and dry.  Neurological:     General: No focal deficit present.     Mental Status: She is alert and oriented to person, place, and time.  Psychiatric:        Mood and Affect: Mood normal.        Behavior: Behavior normal.     MAU Course  Procedures  MDM  NST obtained and reactive.  CBC, UA, Urine Cx ordered.  Will trial Percocet and Zofran.  Patient reports pain much improved on 2 hour recheck. Urinated large volume.   Assessment and Plan    1. Nephrolithiasis 2. Pelvic pain affecting pregnancy in second trimester, antepartum 3. Abnormal urinalysis  Patient with known nephrolithiasis presenting with flank and pelvic pain, suspect related to this. Pain controlled with Percocet on reevaluation. WBC stable. UA with small HgB, positive nitrites, rare bacteria. Potentially concerning for acute cystitis as well. Will send for OB urine culture and treat with appropriate antibiotics. Encouraged patient to f/u with urology in AM to discuss potential management options. Continue Flomax daily. Rx sent for Percocet and Zofran.      De Hollingshead 10/17/2020, 5:35 PM

## 2020-10-17 NOTE — Discharge Instructions (Signed)
Call the urologist in the morning for further instructions. You can take Percocet for pain as needed and Zofran for nausea. We will call you if your urine culture shows a UTI and you need an antibiotic.

## 2020-10-17 NOTE — MAU Note (Signed)
Pt states she is having lower abdominal, vaginal, and back pain.   Pt reports that she has been able to pee, but not that much.   Pt reports that it feels like something is stuck in the pee hole.  Pt reports wiping one time yesterday and saw some blood she is unsure where the blood came from. She thinks it came from her vagina.

## 2020-10-18 ENCOUNTER — Telehealth: Payer: Self-pay

## 2020-10-18 LAB — CULTURE, OB URINE: Culture: NO GROWTH

## 2020-10-18 NOTE — Telephone Encounter (Signed)
Pt reports she went to ER with difficulty voiding and felt like she way passing a stone. Reports after several hours of not being able to void urine came out.  Pt reports ER also did urine culture on urine as well.  Pt wanted MD to know. Message sent to MD

## 2020-11-01 ENCOUNTER — Other Ambulatory Visit: Payer: Self-pay

## 2020-11-01 ENCOUNTER — Ambulatory Visit (HOSPITAL_COMMUNITY)
Admission: RE | Admit: 2020-11-01 | Discharge: 2020-11-01 | Disposition: A | Discharge status: Home / Self Care | Payer: Medicaid Other | Source: Ambulatory Visit | Attending: Urology | Admitting: Urology

## 2020-11-01 DIAGNOSIS — N200 Calculus of kidney: Secondary | ICD-10-CM | POA: Diagnosis not present

## 2020-11-01 DIAGNOSIS — N1330 Unspecified hydronephrosis: Secondary | ICD-10-CM | POA: Diagnosis not present

## 2020-11-01 DIAGNOSIS — N202 Calculus of kidney with calculus of ureter: Secondary | ICD-10-CM | POA: Diagnosis not present

## 2020-11-04 DIAGNOSIS — Z419 Encounter for procedure for purposes other than remedying health state, unspecified: Secondary | ICD-10-CM | POA: Diagnosis not present

## 2020-11-08 ENCOUNTER — Other Ambulatory Visit: Payer: Self-pay | Admitting: Internal Medicine

## 2020-11-08 ENCOUNTER — Encounter: Payer: Self-pay | Admitting: Urology

## 2020-11-08 ENCOUNTER — Ambulatory Visit (INDEPENDENT_AMBULATORY_CARE_PROVIDER_SITE_OTHER): Payer: Medicaid Other | Admitting: Urology

## 2020-11-08 ENCOUNTER — Other Ambulatory Visit: Payer: Self-pay

## 2020-11-08 VITALS — BP 102/68 | HR 80 | Temp 97.4°F

## 2020-11-08 DIAGNOSIS — N200 Calculus of kidney: Secondary | ICD-10-CM | POA: Diagnosis not present

## 2020-11-08 LAB — POCT URINALYSIS DIPSTICK
Bilirubin, UA: NEGATIVE
Blood, UA: NEGATIVE
Glucose, UA: NEGATIVE
Ketones, UA: NEGATIVE
Leukocytes, UA: NEGATIVE
Nitrite, UA: NEGATIVE
Protein, UA: NEGATIVE
Spec Grav, UA: 1.025 (ref 1.010–1.025)
Urobilinogen, UA: 1 E.U./dL
pH, UA: 7 (ref 5.0–8.0)

## 2020-11-08 NOTE — Patient Instructions (Signed)

## 2020-11-08 NOTE — Progress Notes (Signed)
Urological Symptom Review  Patient is experiencing the following symptoms: Burning/pain with urination Get up at night to urinate Currently pregnant  Review of Systems  Gastrointestinal (upper)  : Negative for upper GI symptoms  Gastrointestinal (lower) : Constipation  Constitutional : Negative for symptoms  Skin: Negative for skin symptoms  Eyes: Negative for eye symptoms  Ear/Nose/Throat : Negative for Ear/Nose/Throat symptoms  Hematologic/Lymphatic: Negative for Hematologic/Lymphatic symptoms  Cardiovascular : Negative for cardiovascular symptoms  Respiratory : Negative for respiratory symptoms  Endocrine: Negative for endocrine symptoms  Musculoskeletal: Negative for musculoskeletal symptoms  Neurological: Negative for neurological symptoms  Psychologic: Negative for psychiatric symptoms

## 2020-11-08 NOTE — Progress Notes (Signed)
11/08/2020 10:36 AM   Theresa Shepherd 21-Apr-1986 [id number]  Referring provider: No referring provider defined for this encounter.  nephrolithiasis  HPI: Ms Nasby is a 34yo here for followup for nephrolithiasis. The day after her last appointment she went to the ER with an inability to urinate and then passed a calculus. She denies any flank pain. Her LUTS resolved.    PMH: Past Medical History:  Diagnosis Date  . Asthma    as child  . Eczema   . Infection    UTI  . Kidney stones   . UTI (urinary tract infection)     Surgical History: Past Surgical History:  Procedure Laterality Date  . CESAREAN SECTION    . INDUCED ABORTION      Home Medications:  Allergies as of 11/08/2020      Reactions   Augmentin [amoxicillin-pot Clavulanate] Hives      Medication List       Accurate as of November 08, 2020 10:36 AM. If you have any questions, ask your nurse or doctor.        clotrimazole-betamethasone cream Commonly known as: LOTRISONE Apply topically.   diphenhydramine-acetaminophen 25-500 MG Tabs tablet Commonly known as: TYLENOL PM Take 1 tablet by mouth at bedtime as needed.   ondansetron 4 MG tablet Commonly known as: Zofran Take 1 tablet (4 mg total) by mouth every 8 (eight) hours as needed for nausea or vomiting.   oxyCODONE-acetaminophen 5-325 MG tablet Commonly known as: PERCOCET/ROXICET Take 2 tablets by mouth every 4 (four) hours as needed for severe pain.   PRENATAL VITAMINS PO Take by mouth.   tamsulosin 0.4 MG Caps capsule Commonly known as: FLOMAX Take 1 capsule (0.4 mg total) by mouth daily.   triamcinolone 0.1 % cream : eucerin Crea Apply 1 application topically 2 (two) times daily.       Allergies:  Allergies  Allergen Reactions  . Augmentin [Amoxicillin-Pot Clavulanate] Hives    Family History: Family History  Problem Relation Age of Onset  . Diabetes Maternal Grandmother   . Heart disease Maternal Grandmother   . Asthma  Other   . Hypertension Other   . Diabetes Other   . Heart disease Paternal Grandfather   . Diabetes Mother   . Hypertension Mother   . COPD Mother   . Hypertension Father     Social History:  reports that she has never smoked. She has never used smokeless tobacco. She reports previous alcohol use of about 3.0 standard drinks of alcohol per week. She reports previous drug use. Drug: Marijuana.  ROS: All other review of systems were reviewed and are negative except what is noted above in HPI  Physical Exam: BP 102/68   Pulse 80   Temp (!) 97.4 F (36.3 C)   LMP 05/07/2020   Constitutional:  Alert and oriented, No acute distress. HEENT: [place] AT, moist mucus membranes.  Trachea midline, no masses. Cardiovascular: No clubbing, cyanosis, or edema. Respiratory: Normal respiratory effort, no increased work of breathing. GI: Abdomen is soft, nontender, nondistended, no abdominal masses GU: No CVA tenderness.  Lymph: No cervical or inguinal lymphadenopathy. Skin: No rashes, bruises or suspicious lesions. Neurologic: Grossly intact, no focal deficits, moving all 4 extremities. Psychiatric: Normal mood and affect.  Laboratory Data: Lab Results  Component Value Date   WBC 14.2 (H) 10/17/2020   HGB 10.4 (L) 10/17/2020   HCT 30.2 (L) 10/17/2020   MCV 90.7 10/17/2020   PLT 191 10/17/2020    Lab  Results  Component Value Date   CREATININE 0.57 09/17/2020    No results found for: PSA  No results found for: TESTOSTERONE  No results found for: HGBA1C  Urinalysis    Component Value Date/Time   COLORURINE AMBER (A) 10/17/2020 1840   APPEARANCEUR CLEAR 10/17/2020 1840   APPEARANCEUR Clear 10/16/2020 1346   LABSPEC 1.004 (L) 10/17/2020 1840   PHURINE 6.0 10/17/2020 1840   GLUCOSEU NEGATIVE 10/17/2020 1840   HGBUR SMALL (A) 10/17/2020 1840   BILIRUBINUR negative 11/08/2020 1024   BILIRUBINUR Negative 10/16/2020 1346   KETONESUR NEGATIVE 10/17/2020 1840   PROTEINUR Negative  11/08/2020 1024   PROTEINUR NEGATIVE 10/17/2020 1840   UROBILINOGEN 1.0 11/08/2020 1024   UROBILINOGEN 0.2 09/28/2019 1151   NITRITE negative 11/08/2020 1024   NITRITE POSITIVE (A) 10/17/2020 1840   LEUKOCYTESUR Negative 11/08/2020 1024   LEUKOCYTESUR NEGATIVE 10/17/2020 1840    Lab Results  Component Value Date   LABMICR See below: 10/16/2020   WBCUA 0-5 10/16/2020   LABEPIT 0-10 10/16/2020   BACTERIA RARE (A) 10/17/2020    Pertinent Imaging: Renal US 12/30. Images reviewed and discussed witht he patient No results found for this or any previous visit.  No results found for this or any previous visit.  No results found for this or any previous visit.  No results found for this or any previous visit.  Results for orders placed during the hospital encounter of 11/01/20  Ultrasound renal complete  Narrative CLINICAL DATA:  Renal stones.  EXAM: RENAL / URINARY TRACT ULTRASOUND COMPLETE  COMPARISON:  September 17, 2020.  FINDINGS: Right Kidney:  Renal measurements: 11.7 x 6.5 x 6.2 cm = volume: 246 mL. Several echogenic foci are noted concerning for renal calculi. Mild to moderate hydronephrosis is noted. Echogenicity within normal limits. No mass visualized.  Left Kidney:  Renal measurements: 11.0 x 5.6 x 5.4 cm = volume: 169 mL. Several echogenic foci are noted suggesting nephrolithiasis. Echogenicity within normal limits. No mass or hydronephrosis visualized.  Bladder:  Appears normal for degree of bladder distention. Ureteral jets are not visualized.  Other:  None.  IMPRESSION: Bilateral nephrolithiasis.  Mild to moderate right hydronephrosis.   Electronically Signed By: Lupita Raider M.D. On: 11/02/2020 08:13  No results found for this or any previous visit.  No results found for this or any previous visit.  No results found for this or any previous visit.   Assessment & Plan:    1. Nephrolithiasis -RTC 6 weeks with renal US - POCT  urinalysis dipstick   No follow-ups on file.  Wilkie Aye, MD  [facility] [place]

## 2020-11-23 DIAGNOSIS — Z3A28 28 weeks gestation of pregnancy: Secondary | ICD-10-CM | POA: Diagnosis not present

## 2020-11-23 DIAGNOSIS — Z3491 Encounter for supervision of normal pregnancy, unspecified, first trimester: Secondary | ICD-10-CM | POA: Diagnosis not present

## 2020-11-23 DIAGNOSIS — O26849 Uterine size-date discrepancy, unspecified trimester: Secondary | ICD-10-CM | POA: Diagnosis not present

## 2020-12-04 DIAGNOSIS — Z20828 Contact with and (suspected) exposure to other viral communicable diseases: Secondary | ICD-10-CM | POA: Diagnosis not present

## 2020-12-05 DIAGNOSIS — O99810 Abnormal glucose complicating pregnancy: Secondary | ICD-10-CM | POA: Diagnosis not present

## 2020-12-05 DIAGNOSIS — Z419 Encounter for procedure for purposes other than remedying health state, unspecified: Secondary | ICD-10-CM | POA: Diagnosis not present

## 2020-12-11 ENCOUNTER — Encounter: Payer: Self-pay | Admitting: Registered"

## 2020-12-11 ENCOUNTER — Other Ambulatory Visit: Payer: Self-pay

## 2020-12-11 ENCOUNTER — Encounter: Payer: Medicaid Other | Attending: Obstetrics and Gynecology | Admitting: Registered"

## 2020-12-11 DIAGNOSIS — O24419 Gestational diabetes mellitus in pregnancy, unspecified control: Secondary | ICD-10-CM

## 2020-12-11 NOTE — Progress Notes (Signed)
Patient was seen on 12/11/20 for Gestational Diabetes self-management education at the [facility]. The following learning objectives were met by the patient during this course:   States the definition of Gestational Diabetes  States why dietary management is important in controlling blood glucose  Describes the effects each nutrient has on blood glucose levels  Demonstrates ability to create a balanced meal plan  Demonstrates carbohydrate counting   States when to check blood glucose levels  Demonstrates proper blood glucose monitoring techniques  States the effect of stress and exercise on blood glucose levels  States the importance of limiting caffeine and abstaining from alcohol and smoking  Blood glucose monitor given: Accu-chek Guide Me Lot #[id number] Exp: 12/24/2021 CBG: 142 mg/dL  Patient instructed to monitor glucose levels: FBS: 60 - <95; 1 hour: <140; 2 hour: <120  Patient received handouts:  Nutrition Diabetes and Pregnancy, including carb counting list  Patient will be seen for follow-up as needed.

## 2020-12-17 IMAGING — US US RENAL
1 series · 15 of 25 positions shown · non-contrast
Comparison: May 29, 2020

CLINICAL DATA: Nineteen weeks pregnant with hematuria

EXAM:
RENAL / URINARY TRACT ULTRASOUND COMPLETE

[Series 1: us renal · 75 acquisitions, 15 frames shown]
[im 1/75]
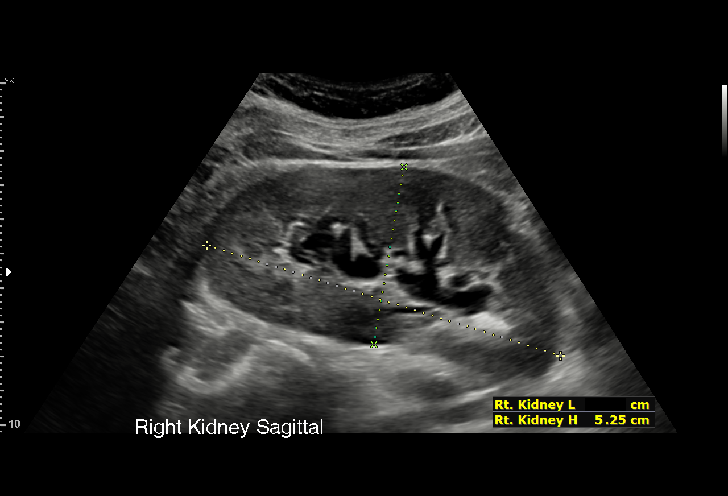
[im 7/75]
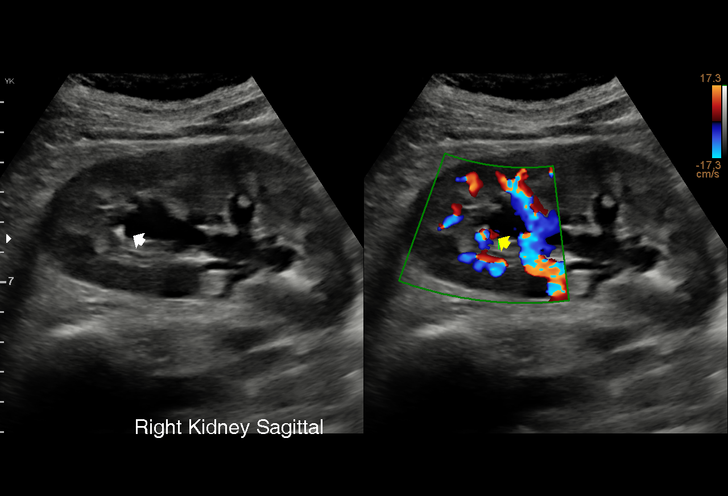
[im 13/75]
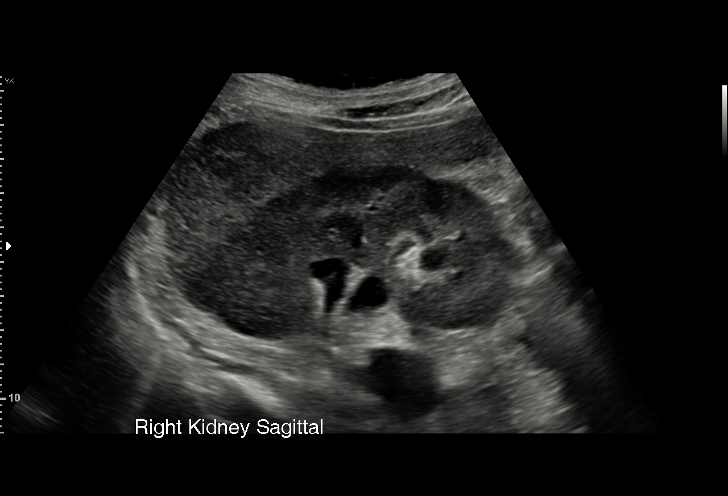
[im 16/75]
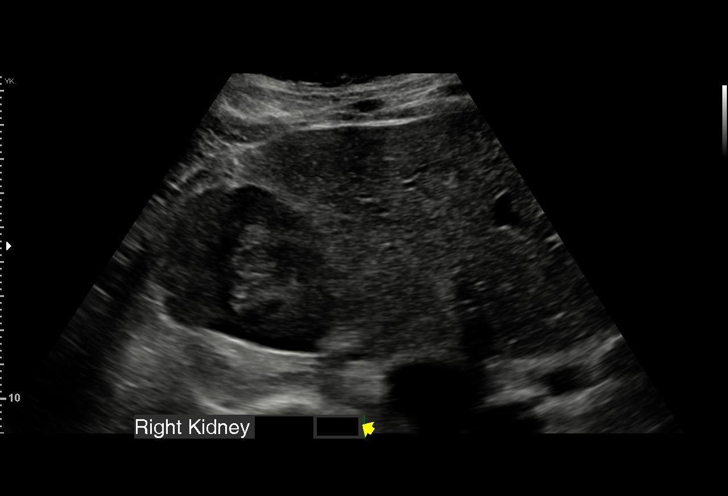
[im 22/75]
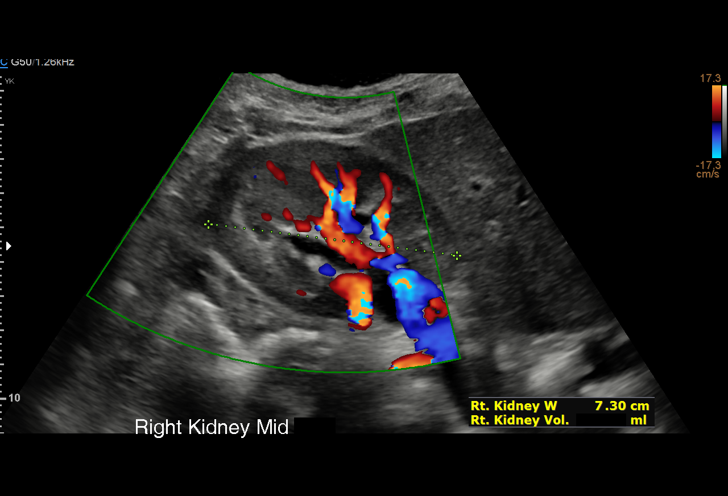
[im 28/75]
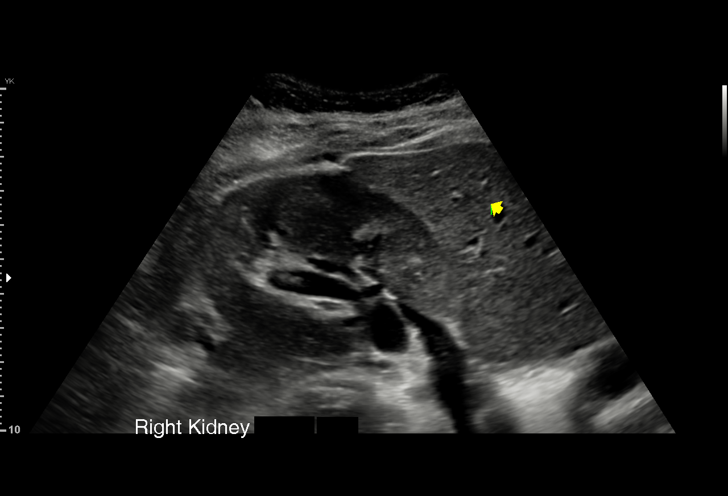
[im 31/75]
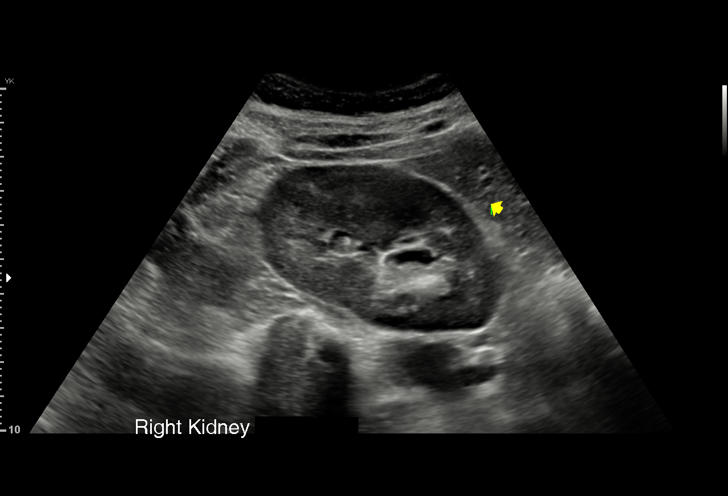
[im 38/75]
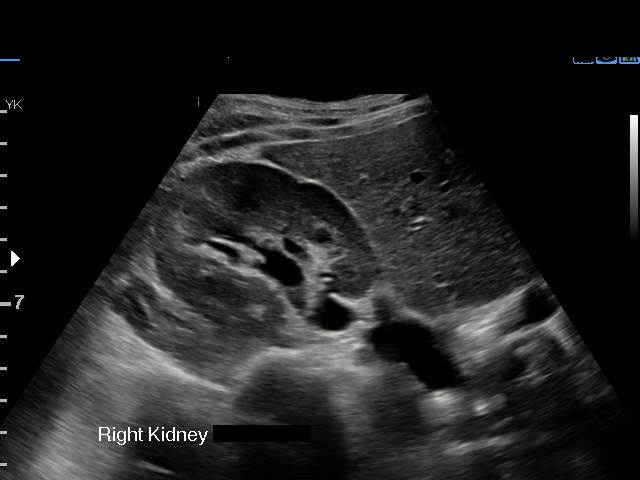
[im 44/75]
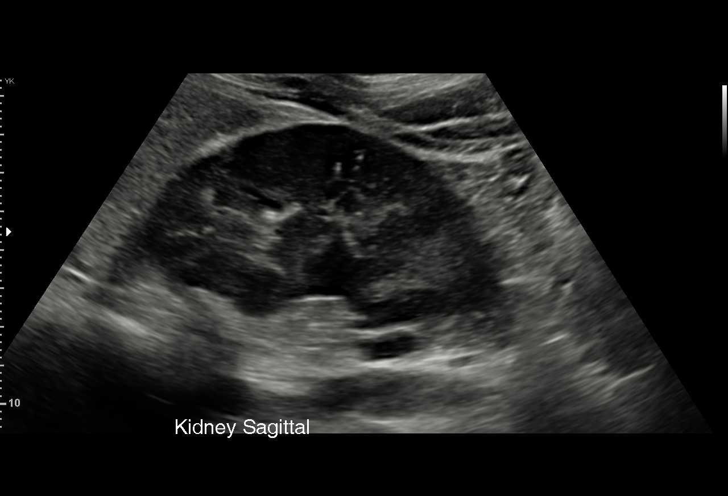
[im 47/75]
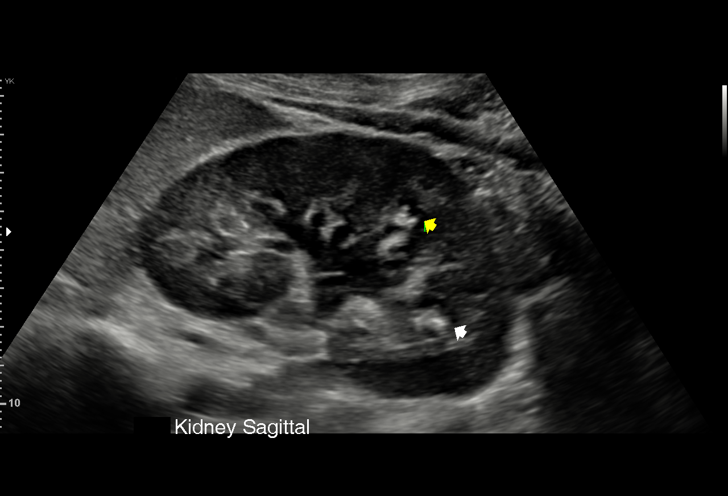
[im 53/75]
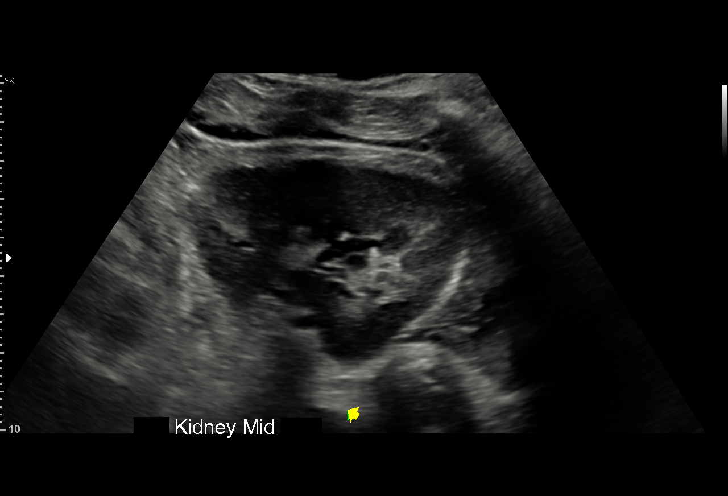
[im 59/75]
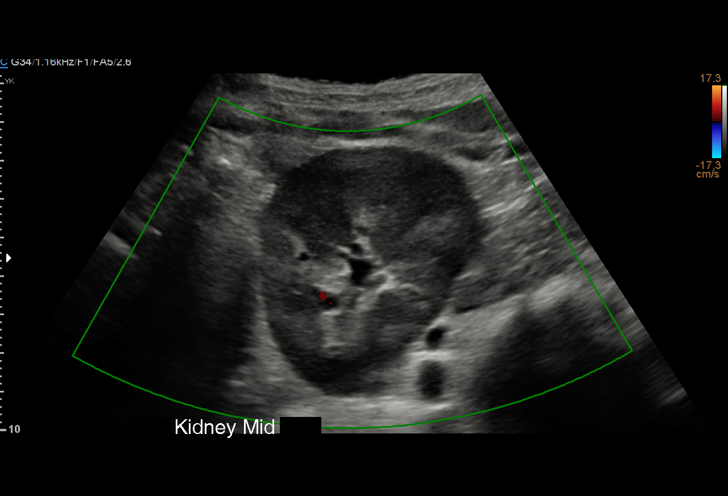
[im 62/75]
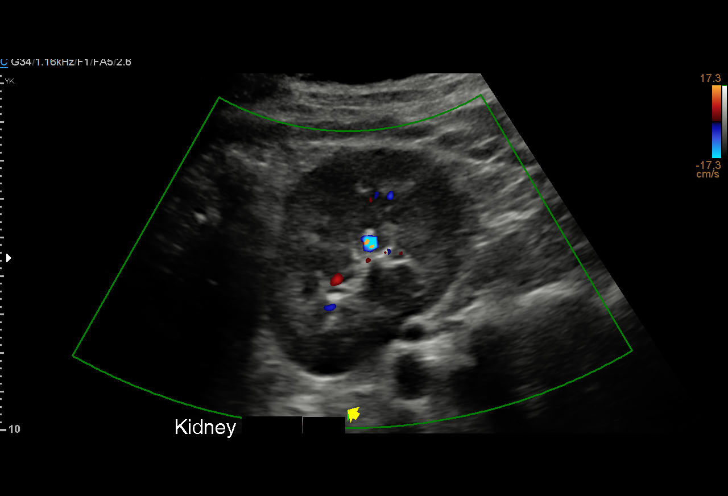
[im 68/75]
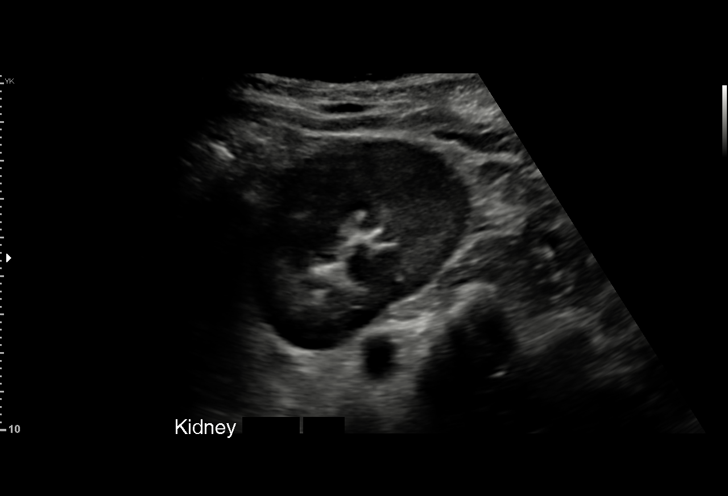
[im 75/75]
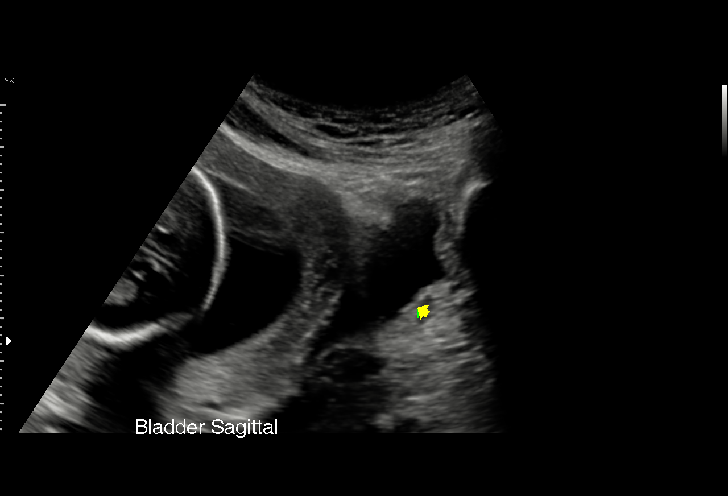

[15 of 25 positions shown; findings below may reference images not displayed]

FINDINGS: Right Kidney:

Renal measurements: 10.8 x 5.3 x 7.3 cm = volume: 217 mL.
Echogenicity is within normal limits. There is mild to moderate
hydronephrosis. There are several echogenic foci with twinkle
artifact consistent with nephrolithiasis. There is 1 within the
inferior pole which likely corresponds to a stone seen on prior CT
and measures 12 mm. There is a possible additional stone in the
superior pole which measures 4 mm. Proximal ureter is mildly
dilated.

Left Kidney:

Renal measurements: 7.0 x 5.7 x 6.5 cm = volume: 192 mL. There is
mild pelviectasis. There are multiple echogenic foci with twinkle
artifact consistent with nephrolithiasis, the largest of which
measures 4 mm. They are seen in all poles of the kidney.

Bladder:

Appears normal for degree of bladder distention. No definitive
obstructing urolithiasis is visualized.

Other:

None.
IMPRESSION: 1. There is mild-to-moderate RIGHT hydroureteronephrosis and mild
LEFT pelviectasis. This is nonspecific and could be due to gravid
state.
2. Multiple nephrolithiasis are visualized bilaterally. No
definitive obstructing urolithiasis visualized.

## 2020-12-21 DIAGNOSIS — O3663X0 Maternal care for excessive fetal growth, third trimester, not applicable or unspecified: Secondary | ICD-10-CM | POA: Diagnosis not present

## 2020-12-21 DIAGNOSIS — N898 Other specified noninflammatory disorders of vagina: Secondary | ICD-10-CM | POA: Diagnosis not present

## 2020-12-21 DIAGNOSIS — Z3A32 32 weeks gestation of pregnancy: Secondary | ICD-10-CM | POA: Diagnosis not present

## 2020-12-27 ENCOUNTER — Ambulatory Visit: Payer: Medicaid Other | Admitting: Urology

## 2021-01-02 ENCOUNTER — Other Ambulatory Visit: Payer: Self-pay | Admitting: Obstetrics and Gynecology

## 2021-01-02 DIAGNOSIS — Z419 Encounter for procedure for purposes other than remedying health state, unspecified: Secondary | ICD-10-CM | POA: Diagnosis not present

## 2021-01-05 ENCOUNTER — Ambulatory Visit: Payer: Medicaid Other | Admitting: Urology

## 2021-01-05 DIAGNOSIS — N200 Calculus of kidney: Secondary | ICD-10-CM

## 2021-01-17 DIAGNOSIS — O24419 Gestational diabetes mellitus in pregnancy, unspecified control: Secondary | ICD-10-CM | POA: Diagnosis not present

## 2021-01-17 DIAGNOSIS — O3663X0 Maternal care for excessive fetal growth, third trimester, not applicable or unspecified: Secondary | ICD-10-CM | POA: Diagnosis not present

## 2021-01-17 DIAGNOSIS — Z87442 Personal history of urinary calculi: Secondary | ICD-10-CM | POA: Diagnosis not present

## 2021-01-17 DIAGNOSIS — Z9889 Other specified postprocedural states: Secondary | ICD-10-CM | POA: Diagnosis not present

## 2021-01-17 DIAGNOSIS — Z113 Encounter for screening for infections with a predominantly sexual mode of transmission: Secondary | ICD-10-CM | POA: Diagnosis not present

## 2021-01-17 DIAGNOSIS — Z331 Pregnant state, incidental: Secondary | ICD-10-CM | POA: Diagnosis not present

## 2021-01-17 DIAGNOSIS — Z3A36 36 weeks gestation of pregnancy: Secondary | ICD-10-CM | POA: Diagnosis not present

## 2021-01-17 DIAGNOSIS — Z22330 Carrier of Group B streptococcus: Secondary | ICD-10-CM | POA: Diagnosis not present

## 2021-01-25 NOTE — Patient Instructions (Signed)
[name]  01/25/2021   Your procedure is scheduled on:  02/06/2021  Arrive at 0745 at Entrance C on [address] at [facility]  and [employer]. You are invited to use the FREE valet parking or use the Visitor's parking deck.  Pick up the phone at the desk and dial [phone].  Call this number if you have problems the morning of surgery: [phone]  Remember:   Do not eat food:(After Midnight) Desps de medianoche.  Do not drink clear liquids: (After Midnight) Desps de medianoche.  Take these medicines the morning of surgery with A SIP OF WATER:  none   Do not wear jewelry, make-up or nail polish.  Do not wear lotions, powders, or perfumes. Do not wear deodorant.  Do not shave 48 hours prior to surgery.  Do not bring valuables to the hospital.  [facility] is not   responsible for any belongings or valuables brought to the hospital.  Contacts, dentures or bridgework may not be worn into surgery.  Leave suitcase in the car. After surgery it may be brought to your room.  For patients admitted to the hospital, checkout time is 11:00 AM the day of              discharge.      Please read over the following fact sheets that you were given:     Preparing for Surgery

## 2021-01-26 ENCOUNTER — Telehealth (HOSPITAL_COMMUNITY): Payer: Self-pay | Admitting: *Deleted

## 2021-01-26 NOTE — Telephone Encounter (Signed)
Preadmission screen  

## 2021-01-29 ENCOUNTER — Encounter (HOSPITAL_COMMUNITY): Payer: Self-pay | Admitting: Obstetrics and Gynecology

## 2021-01-29 ENCOUNTER — Other Ambulatory Visit: Payer: Self-pay

## 2021-01-29 ENCOUNTER — Telehealth (HOSPITAL_COMMUNITY): Payer: Self-pay | Admitting: *Deleted

## 2021-01-29 ENCOUNTER — Inpatient Hospital Stay (HOSPITAL_COMMUNITY)
Admission: AD | Admit: 2021-01-29 | Discharge: 2021-01-29 | Disposition: A | Discharge status: Home / Self Care | Payer: Medicaid Other | Attending: Obstetrics and Gynecology | Admitting: Obstetrics and Gynecology

## 2021-01-29 DIAGNOSIS — O26893 Other specified pregnancy related conditions, third trimester: Secondary | ICD-10-CM

## 2021-01-29 DIAGNOSIS — O99613 Diseases of the digestive system complicating pregnancy, third trimester: Secondary | ICD-10-CM | POA: Diagnosis not present

## 2021-01-29 DIAGNOSIS — Z3A38 38 weeks gestation of pregnancy: Secondary | ICD-10-CM | POA: Insufficient documentation

## 2021-01-29 DIAGNOSIS — K219 Gastro-esophageal reflux disease without esophagitis: Secondary | ICD-10-CM | POA: Diagnosis not present

## 2021-01-29 DIAGNOSIS — Z3493 Encounter for supervision of normal pregnancy, unspecified, third trimester: Secondary | ICD-10-CM

## 2021-01-29 DIAGNOSIS — R8271 Bacteriuria: Secondary | ICD-10-CM

## 2021-01-29 DIAGNOSIS — Z3689 Encounter for other specified antenatal screening: Secondary | ICD-10-CM | POA: Diagnosis not present

## 2021-01-29 DIAGNOSIS — Z79899 Other long term (current) drug therapy: Secondary | ICD-10-CM | POA: Diagnosis not present

## 2021-01-29 DIAGNOSIS — R12 Heartburn: Secondary | ICD-10-CM

## 2021-01-29 LAB — URINALYSIS, ROUTINE W REFLEX MICROSCOPIC
Bilirubin Urine: NEGATIVE
Glucose, UA: NEGATIVE mg/dL
Hgb urine dipstick: NEGATIVE
Ketones, ur: NEGATIVE mg/dL
Leukocytes,Ua: NEGATIVE
Nitrite: NEGATIVE
Protein, ur: NEGATIVE mg/dL
Specific Gravity, Urine: 1.004 — ABNORMAL LOW (ref 1.005–1.030)
pH: 6 (ref 5.0–8.0)

## 2021-01-29 LAB — WET PREP, GENITAL
Clue Cells Wet Prep HPF POC: NONE SEEN
Sperm: NONE SEEN
Trich, Wet Prep: NONE SEEN
Yeast Wet Prep HPF POC: NONE SEEN

## 2021-01-29 LAB — POCT FERN TEST: POCT Fern Test: NEGATIVE

## 2021-01-29 MED ORDER — ONDANSETRON 8 MG PO TBDP: 8.0000 mg | ORAL_TABLET | Freq: Three times a day (TID) | ORAL | 0 refills | Status: DC | PRN

## 2021-01-29 MED ORDER — FAMOTIDINE 20 MG PO TABS: 20.0000 mg | ORAL_TABLET | Freq: Two times a day (BID) | ORAL | 0 refills | Status: DC

## 2021-01-29 NOTE — Discharge Instructions (Signed)
Food Choices for Gastroesophageal Reflux Disease, Adult When you have gastroesophageal reflux disease (GERD), the foods you eat and your eating habits are very important. Choosing the right foods can help ease your discomfort. Think about working with a food expert (dietitian) to help you make good choices. What are tips for following this plan? Reading food labels  Look for foods that are low in saturated fat. Foods that may help with your symptoms include: ? Foods that have less than 5% of daily value (DV) of fat. ? Foods that have 0 grams of trans fat. Cooking  Do not fry your food.  Cook your food by baking, steaming, grilling, or broiling. These are all methods that do not need a lot of fat for cooking.  To add flavor, try to use herbs that are low in spice and acidity. Meal planning  Choose healthy foods that are low in fat, such as: ? Fruits and vegetables. ? Whole grains. ? Low-fat dairy products. ? Lean meats, fish, and poultry.  Eat small meals often instead of eating 3 large meals each day. Eat your meals slowly in a place where you are relaxed. Avoid bending over or lying down until 2-3 hours after eating.  Limit high-fat foods such as fatty meats or fried foods.  Limit your intake of fatty foods, such as oils, butter, and shortening.  Avoid the following as told by your doctor: ? Foods that cause symptoms. These may be different for different people. Keep a food diary to keep track of foods that cause symptoms. ? Alcohol. ? Drinking a lot of liquid with meals. ? Eating meals during the 2-3 hours before bed.   Lifestyle  Stay at a healthy weight. Ask your doctor what weight is healthy for you. If you need to lose weight, work with your doctor to do so safely.  Exercise for at least 30 minutes on 5 or more days each week, or as told by your doctor.  Wear loose-fitting clothes.  Do not smoke or use any products that contain nicotine or tobacco. If you need help  quitting, ask your doctor.  Sleep with the head of your bed higher than your feet. Use a wedge under the mattress or blocks under the bed frame to raise the head of the bed.  Chew sugar-free gum after meals. What foods should eat? Eat a healthy, well-balanced diet of fruits, vegetables, whole grains, low-fat dairy products, lean meats, fish, and poultry. Each person is different. Foods that may cause symptoms in one person may not cause any symptoms in another person. Work with your doctor to find foods that are safe for you. The items listed above may not be a complete list of what you can eat and drink. Contact a food expert for more options.   What foods should I avoid? Limiting some of these foods may help in managing the symptoms of GERD. Everyone is different. Talk with a food expert or your doctor to help you find the exact foods to avoid, if any. Fruits Any fruits prepared with added fat. Any fruits that cause symptoms. For some people, this may include citrus fruits, such as oranges, grapefruit, pineapple, and lemons. Vegetables Deep-fried vegetables. French fries. Any vegetables prepared with added fat. Any vegetables that cause symptoms. For some people, this may include tomatoes and tomato products, chili peppers, onions and garlic, and horseradish. Grains Pastries or quick breads with added fat. Meats and other proteins High-fat meats, such as fatty beef or pork,   hot dogs, ribs, ham, sausage, salami, and bacon. Fried meat or protein, including fried fish and fried chicken. Nuts and nut butters, in large amounts. Dairy Whole milk and chocolate milk. Sour cream. Cream. Ice cream. Cream cheese. Milkshakes. Fats and oils Butter. Margarine. Shortening. Ghee. Beverages Coffee and tea, with or without caffeine. Carbonated beverages. Sodas. Energy drinks. Fruit juice made with acidic fruits, such as orange or grapefruit. Tomato juice. Alcoholic drinks. Sweets and desserts Chocolate and  cocoa. Donuts. Seasonings and condiments Pepper. Peppermint and spearmint. Added salt. Any condiments, herbs, or seasonings that cause symptoms. For some people, this may include curry, hot sauce, or vinegar-based salad dressings. The items listed above may not be a complete list of what you should not eat and drink. Contact a food expert for more options. Questions to ask your doctor Diet and lifestyle changes are often the first steps that are taken to manage symptoms of GERD. If diet and lifestyle changes do not help, talk with your doctor about taking medicines. Where to find more information  International Foundation for Gastrointestinal Disorders: aboutgerd.org Summary  When you have GERD, food and lifestyle choices are very important in easing your symptoms.  Eat small meals often instead of 3 large meals a day. Eat your meals slowly and in a place where you are relaxed.  Avoid bending over or lying down until 2-3 hours after eating.  Limit high-fat foods such as fatty meats or fried foods. This information is not intended to replace advice given to you by your health care provider. Make sure you discuss any questions you have with your health care provider. Document Revised: 05/01/2020 Document Reviewed: 05/01/2020 Elsevier Patient Education  2021 [employer]  

## 2021-01-29 NOTE — MAU Provider Note (Signed)
Event Date/Time   First Provider Initiated Contact with Patient 01/29/21 2041      S [name] is a 35 y.o. G10P1011 pregnant female at [redacted]w[redacted]d who presents to MAU today with complaint of indigestion last night that continued most of the day, as well as LOF. She began belching prior to bedtime last night, then started feeling worse (refluxy) so took Tums. States "this happens every time I take Tums, it gives me diarrhea". Did not resolve indigestion and caused diarrhea that continued all day, also felt nauseated. No nausea or indigestion now. Takes protonix twice daily but does not think it works. Took a bath earlier this evening and noted "something came out of my vagina while I was in the bath." Denies contractions or vaginal bleeding, no other recent illness or physical complaints. Receives care at [facility].  Comprehensive review of systems complete and negative except what is mentioned in HPI.  O BP 120/76 (BP Location: Right Arm)   Pulse 84   Temp 98.1 F (36.7 C) (Oral)   Resp 17   Ht 5\' 3"  (1.6 m)   Wt 178 lb 12.8 oz (81.1 kg)   LMP 05/07/2020   SpO2 100%   BMI 31.67 kg/m  Physical Exam Vitals and nursing note reviewed.  Constitutional:      General: She is not in acute distress.    Appearance: She is not ill-appearing.  HENT:     Head: Normocephalic and atraumatic.     Mouth/Throat:     Mouth: Mucous membranes are moist.  Eyes:     Pupils: Pupils are equal, round, and reactive to light.  Cardiovascular:     Rate and Rhythm: Normal rate.  Pulmonary:     Effort: Pulmonary effort is normal.  Genitourinary:    General: Normal vulva.     Vagina: Vaginal discharge: physiologic discharge.  Musculoskeletal:        General: Normal range of motion.  Skin:    General: Skin is warm and dry.     Capillary Refill: Capillary refill takes less than 2 seconds.  Neurological:     Mental Status: She is alert and oriented to person, place, and time.   Psychiatric:        Mood and Affect: Mood normal.        Behavior: Behavior normal.        Thought Content: Thought content normal.        Judgment: Judgment normal.   Fetal Tracing: reactive Baseline: 145 Variability: moderate Accelerations: 15x15 Decelerations: none Toco: relaxed  Dilation: 1 Effacement (%): 70 Cervical Position: Posterior Exam by:: [email or web address] Yazaira Speas,CNM  Results for orders placed or performed during the hospital encounter of 01/29/21 (from the past 24 hour(s))  Urinalysis, Routine w reflex microscopic Urine, Clean Catch     Status: Abnormal   Collection Time: 01/29/21  8:33 PM  Result Value Ref Range   Color, Urine STRAW (A) YELLOW   APPearance CLEAR CLEAR   Specific Gravity, Urine 1.004 (L) 1.005 - 1.030   pH 6.0 5.0 - 8.0   Glucose, UA NEGATIVE NEGATIVE mg/dL   Hgb urine dipstick NEGATIVE NEGATIVE   Bilirubin Urine NEGATIVE NEGATIVE   Ketones, ur NEGATIVE NEGATIVE mg/dL   Protein, ur NEGATIVE NEGATIVE mg/dL   Nitrite NEGATIVE NEGATIVE   Leukocytes,Ua NEGATIVE NEGATIVE  Wet prep, genital     Status: Abnormal   Collection Time: 01/29/21  9:05 PM   Specimen: Cervix  Result Value Ref Range  Yeast Wet Prep HPF POC NONE SEEN NONE SEEN   Trich, Wet Prep NONE SEEN NONE SEEN   Clue Cells Wet Prep HPF POC NONE SEEN NONE SEEN   WBC, Wet Prep HPF POC MANY (A) NONE SEEN   Sperm NONE SEEN   Fern Test     Status: Normal   Collection Time: 01/29/21  9:34 PM  Result Value Ref Range   POCT Fern Test Negative = intact amniotic membranes    Discussed management of indigestion and advised not to take Tums if it upsets her stomach this much. Suggested switching reflux meds and adding zofran if needed for nausea. Pt amenable to plan.  A [redacted] weeks gestation of pregnancy Intact amniotic membranes, third trimester GERD in pregnancy  P Discharge from MAU in stable condition with term labor precautions Follow up at [facility] as scheduled for ongoing  prenatal care  Bernerd Limbo, CNM 01/29/2021 9:41 PM

## 2021-01-29 NOTE — MAU Note (Addendum)
PT SAYS LAST NIGHT - STARTED NAUSEA/ INDIGESTION  AND BACK PAIN  TOOK TUMS AT 5A- HELPED  THEN HAD DIARRHEA- FROM 7A-530P.  CALLED DR- NO VOMITING - STILL NAUSEATED. SAYS C/S [facility] C/S  [facility] FOR 4-5- REPEAT.

## 2021-01-29 NOTE — Telephone Encounter (Signed)
Preadmission screen  

## 2021-01-30 ENCOUNTER — Telehealth (HOSPITAL_COMMUNITY): Payer: Self-pay | Admitting: *Deleted

## 2021-01-30 LAB — GC/CHLAMYDIA PROBE AMP (~~LOC~~) NOT AT ARMC
Chlamydia: NEGATIVE
Comment: NEGATIVE
Comment: NORMAL
Neisseria Gonorrhea: NEGATIVE

## 2021-01-30 NOTE — Telephone Encounter (Signed)
Preadmission screen  

## 2021-01-31 ENCOUNTER — Telehealth (HOSPITAL_COMMUNITY): Payer: Self-pay | Admitting: *Deleted

## 2021-01-31 ENCOUNTER — Encounter (HOSPITAL_COMMUNITY): Payer: Self-pay

## 2021-01-31 NOTE — Telephone Encounter (Signed)
Preadmission screen  

## 2021-02-01 DIAGNOSIS — B9689 Other specified bacterial agents as the cause of diseases classified elsewhere: Secondary | ICD-10-CM | POA: Diagnosis not present

## 2021-02-01 DIAGNOSIS — O23593 Infection of other part of genital tract in pregnancy, third trimester: Secondary | ICD-10-CM | POA: Diagnosis not present

## 2021-02-01 DIAGNOSIS — Z9889 Other specified postprocedural states: Secondary | ICD-10-CM | POA: Diagnosis not present

## 2021-02-01 DIAGNOSIS — Z3A38 38 weeks gestation of pregnancy: Secondary | ICD-10-CM | POA: Diagnosis not present

## 2021-02-02 DIAGNOSIS — Z419 Encounter for procedure for purposes other than remedying health state, unspecified: Secondary | ICD-10-CM | POA: Diagnosis not present

## 2021-02-02 NOTE — H&P (Addendum)
Theresa Shepherd is a 35 y.o. female, G3P1001 at 23 3/7 weeks, presenting on 02/06/21 for scheduled repeat cesarean birth.  Seen in MAU 3/28 for indigestion, for which she took Tums, then had diarrhea following Tums ingestion. Also noted more d/c.  FHR reactive, no contractions, good FM noted, cervix 1 cm, 70% on exam. UA neg, wet prep and fern neg.    Patient Active Problem List   Diagnosis Date Noted  . History of cesarean delivery x 1--2012, FTP 02/04/2021  . History of COVID-19--12/2020 02/04/2021  . History of urinary calculi--2021 02/04/2021  . Gestational diabetes mellitus (GDM), antepartum 12/11/2020  . GBS bacteriuria 09/19/2020    History of present pregnancy: Patient entered care at 8 3/7 weeks, in transfer from [facility].   EDC of 02/10/21 was established by LMP, c/w [place] at 8 weeks.   Anatomy scan:  20 3/7 weeks, with normal findings and an anterior placenta.   Additional [place] evaluations:  28 6/7 weeks--EFW 2+8, 94%ile, vtx, anterior placenta, AFI 18.5, cervix closed/3.8 cm long. 32 5/7 weeks--vtx EFW 5+11, 98%ile, anterior placenta, cervix closed, AFI 20.1 37 5/7 weeks--vtx, 7+1, 77%ile, normal fluid.   Significant prenatal events:  Transfer from [facility] at 8 weeks, prior delivery with [facility].  Kidney stone issues during pregnancy, passed several stones, referred to Urology (Dr. Ronne Binning, [facility])--elected Flomax therapy, sporadic use of Percocet with flares, had persistent GBS bacteriuria even after treatment.  Still noted to have large, non-obstructive stone on right side at 28 weeks.  Continued f/u with urology.  COVID 19 infection 12/2020, minor sx.  Dx GDM at 30 weeks, CBGs stable with dietary management.  Elected repeat C/S, requested Dr. Normand Sloop and Nigel Bridgeman, CNM, as surgical team, declined tubal.  Has struggled with constipation during pregnancy.  Some reflux, treated with Protonix.  Declined TDAP.  Treated for BV 02/01/21.   Last evaluation: 02/01/21, VSS, FHR 150,  normotensive, treated for BV.  Cervix closed/50%, vtx, -3.    OB History    Gravida  3   Para  1   Term  1   Preterm      AB  1   Living  1     SAB      IAB  1   Ectopic      Multiple      Live Births  1         2012--primary LTCS for FTP, 38 weeks, [facility], 7lbs, female. 2020--TAB early gestation  Past Medical History:  Diagnosis Date  . Asthma    as child  . Eczema   . Gestational diabetes   . Infection    UTI  . Kidney stones   . UTI (urinary tract infection)    Past Surgical History:  Procedure Laterality Date  . CESAREAN SECTION    . INDUCED ABORTION     Family History: family history includes Asthma in an other family member; COPD in her mother; Diabetes in her maternal grandmother, mother, and another family member; Heart disease in her maternal grandmother and paternal grandfather; Hypertension in her father, mother, and another family member; Stomach cancer in her paternal grandmother. Social History:  reports that she has never smoked. She has never used smokeless tobacco. She reports previous alcohol use of about 3.0 standard drinks of alcohol per week. She reports previous drug use. Drug: Marijuana. (Prior pregnancy).   Patient is single, currently unemployed.    Prenatal Transfer Tool  Maternal Diabetes: Yes:  Diabetes  Type:  Diet controlled Genetic Screening: Normal Panorama and Horizon, neg AFP Maternal Ultrasounds/Referrals: Normal Fetal Ultrasounds or other Referrals:  None Maternal Substance Abuse:  No Significant Maternal Medications:  None Significant Maternal Lab Results: Group B Strep positive  TDAP Declined Flu 2021  ROS: Reports pelvic pressure, notes FM, no leaking/bleeding/HA or any other sx  Allergies  Allergen Reactions  . Augmentin [Amoxicillin-Pot Clavulanate] Hives       Last menstrual period 05/07/2020.  Chest clear Heart RRR without murmur Abd gravid, NT, FH 39 cm Pelvic: Deferred today Ext: WNL  FHR:150 at  last office visit 02/01/21 UCs:  Occasional, mild  Prenatal labs: ABO, Rh: --/--/O POS (04/04 0945) Antibody: NEG (04/04 0945) Rubella:  Immune (09/27 0000) RPR: Nonreactive (09/27 0000)  HBsAg: Negative (09/27 0000)  HIV: Non-reactive (09/27 0000)  GBS:  Positive 10/03/20 urine culture Sickle cell/Hgb electrophoresis:  AA Pap:  06/2020, WNL GC:  11/01/20 Neg Chlamydia:  11/01/20 Neg Genetic screenings:  Low risk Panorama, neg Horizon and AFP Glucola:  Elevated at 158, failed 3 hour GTT at 30 weeks Other:   Hgb 12.8 at NOB, 11.1 at 28 weeks       Assessment/Plan: IUP at 39 3/7 weeks Prior C/S, desires repeat GDM, diet controlled GBS positive Hx urinary calculi Hx COVID 12/2020  [facility] with Dr. Normand Sloop. Routine [facility] pre-op orders Management of GDM per Dr. Normand Sloop.  Nigel Bridgeman CNM, [place] 02/06/2021, 5:56 AM

## 2021-02-04 DIAGNOSIS — Z87442 Personal history of urinary calculi: Secondary | ICD-10-CM

## 2021-02-04 DIAGNOSIS — Z98891 History of uterine scar from previous surgery: Secondary | ICD-10-CM

## 2021-02-04 DIAGNOSIS — Z8616 Personal history of COVID-19: Secondary | ICD-10-CM | POA: Diagnosis not present

## 2021-02-05 ENCOUNTER — Encounter (HOSPITAL_COMMUNITY)
Admission: RE | Admit: 2021-02-05 | Discharge: 2021-02-05 | Disposition: A | Discharge status: Home / Self Care | Payer: Medicaid Other | Source: Ambulatory Visit | Attending: Obstetrics and Gynecology | Admitting: Obstetrics and Gynecology

## 2021-02-05 ENCOUNTER — Other Ambulatory Visit: Payer: Self-pay

## 2021-02-05 ENCOUNTER — Other Ambulatory Visit (HOSPITAL_COMMUNITY): Payer: Medicaid Other

## 2021-02-05 DIAGNOSIS — Z01812 Encounter for preprocedural laboratory examination: Secondary | ICD-10-CM | POA: Insufficient documentation

## 2021-02-05 HISTORY — DX: Gestational diabetes mellitus in pregnancy, unspecified control: O24.419

## 2021-02-05 LAB — CBC
HCT: 33.7 % — ABNORMAL LOW (ref 36.0–46.0)
Hemoglobin: 11.5 g/dL — ABNORMAL LOW (ref 12.0–15.0)
MCH: 30.4 pg (ref 26.0–34.0)
MCHC: 34.1 g/dL (ref 30.0–36.0)
MCV: 89.2 fL (ref 80.0–100.0)
Platelets: 206 10*3/uL (ref 150–400)
RBC: 3.78 MIL/uL — ABNORMAL LOW (ref 3.87–5.11)
RDW: 12.9 % (ref 11.5–15.5)
WBC: 9.7 10*3/uL (ref 4.0–10.5)
nRBC: 0 % (ref 0.0–0.2)

## 2021-02-05 LAB — TYPE AND SCREEN
ABO/RH(D): O POS
Antibody Screen: NEGATIVE

## 2021-02-05 LAB — BASIC METABOLIC PANEL
Anion gap: 9 (ref 5–15)
BUN: 6 mg/dL (ref 6–20)
CO2: 22 mmol/L (ref 22–32)
Calcium: 8.7 mg/dL — ABNORMAL LOW (ref 8.9–10.3)
Chloride: 106 mmol/L (ref 98–111)
Creatinine, Ser: 0.56 mg/dL (ref 0.44–1.00)
GFR, Estimated: >60 mL/min (ref >60)
Glucose, Bld: 78 mg/dL (ref 70–99)
Potassium: 3.6 mmol/L (ref 3.5–5.1)
Sodium: 137 mmol/L (ref 135–145)

## 2021-02-06 ENCOUNTER — Encounter (HOSPITAL_COMMUNITY): Payer: Self-pay | Admitting: Obstetrics and Gynecology

## 2021-02-06 ENCOUNTER — Inpatient Hospital Stay (HOSPITAL_COMMUNITY): Payer: Medicaid Other | Admitting: Certified Registered Nurse Anesthetist

## 2021-02-06 ENCOUNTER — Encounter (HOSPITAL_COMMUNITY)
Admission: RE | Disposition: A | Discharge status: Home / Self Care | Payer: Self-pay | Source: Home / Self Care | Attending: Obstetrics and Gynecology

## 2021-02-06 ENCOUNTER — Other Ambulatory Visit: Payer: Self-pay

## 2021-02-06 ENCOUNTER — Inpatient Hospital Stay (HOSPITAL_COMMUNITY)
Admission: RE | Admit: 2021-02-06 | Discharge: 2021-02-08 | DRG: 788 | Disposition: A | Discharge status: Home / Self Care | Payer: Medicaid Other | Attending: Obstetrics and Gynecology | Admitting: Obstetrics and Gynecology

## 2021-02-06 DIAGNOSIS — O99824 Streptococcus B carrier state complicating childbirth: Secondary | ICD-10-CM | POA: Diagnosis not present

## 2021-02-06 DIAGNOSIS — O34219 Maternal care for unspecified type scar from previous cesarean delivery: Secondary | ICD-10-CM | POA: Diagnosis not present

## 2021-02-06 DIAGNOSIS — O9962 Diseases of the digestive system complicating childbirth: Secondary | ICD-10-CM | POA: Diagnosis present

## 2021-02-06 DIAGNOSIS — Z3A39 39 weeks gestation of pregnancy: Secondary | ICD-10-CM

## 2021-02-06 DIAGNOSIS — Z87442 Personal history of urinary calculi: Secondary | ICD-10-CM

## 2021-02-06 DIAGNOSIS — O24429 Gestational diabetes mellitus in childbirth, unspecified control: Secondary | ICD-10-CM | POA: Diagnosis not present

## 2021-02-06 DIAGNOSIS — O34211 Maternal care for low transverse scar from previous cesarean delivery: Secondary | ICD-10-CM | POA: Diagnosis not present

## 2021-02-06 DIAGNOSIS — Z8616 Personal history of COVID-19: Secondary | ICD-10-CM | POA: Diagnosis not present

## 2021-02-06 DIAGNOSIS — K219 Gastro-esophageal reflux disease without esophagitis: Secondary | ICD-10-CM | POA: Diagnosis present

## 2021-02-06 DIAGNOSIS — Z98891 History of uterine scar from previous surgery: Secondary | ICD-10-CM

## 2021-02-06 DIAGNOSIS — O24420 Gestational diabetes mellitus in childbirth, diet controlled: Secondary | ICD-10-CM | POA: Diagnosis not present

## 2021-02-06 DIAGNOSIS — Z3A00 Weeks of gestation of pregnancy not specified: Secondary | ICD-10-CM | POA: Diagnosis not present

## 2021-02-06 LAB — GLUCOSE, CAPILLARY
Glucose-Capillary: 80 mg/dL (ref 70–99)
Glucose-Capillary: 96 mg/dL (ref 70–99)

## 2021-02-06 LAB — RPR: RPR Ser Ql: NONREACTIVE

## 2021-02-06 SURGERY — Surgical Case
Anesthesia: Spinal | Site: Abdomen | Wound class: Clean Contaminated

## 2021-02-06 MED ORDER — BUPIVACAINE IN DEXTROSE 0.75-8.25 % IT SOLN
INTRATHECAL | Status: DC | PRN
  Administered 2021-02-06: 1.6 mL via INTRATHECAL

## 2021-02-06 MED ORDER — OXYCODONE HCL 5 MG PO TABS: 5.0000 mg | ORAL_TABLET | Freq: Once | ORAL | Status: DC | PRN

## 2021-02-06 MED ORDER — NALOXONE HCL 0.4 MG/ML IJ SOLN: 0.4000 mg | INTRAMUSCULAR | Status: DC | PRN

## 2021-02-06 MED ORDER — MORPHINE SULFATE (PF) 0.5 MG/ML IJ SOLN
INTRAMUSCULAR | Status: DC | PRN
  Administered 2021-02-06: 150 ug via INTRATHECAL

## 2021-02-06 MED ORDER — STERILE WATER FOR IRRIGATION IR SOLN
Status: DC | PRN
Start: 2021-02-06 — End: 2021-02-06
  Administered 2021-02-06: 1000 mL

## 2021-02-06 MED ORDER — NALBUPHINE HCL 10 MG/ML IJ SOLN: 5.0000 mg | INTRAMUSCULAR | Status: DC | PRN

## 2021-02-06 MED ORDER — NALOXONE HCL 4 MG/10ML IJ SOLN
1.0000 ug/kg/h | INTRAVENOUS | Status: DC | PRN
  Filled 2021-02-06: qty 5

## 2021-02-06 MED ORDER — PROMETHAZINE HCL 25 MG/ML IJ SOLN
6.2500 mg | INTRAMUSCULAR | Status: DC | PRN
  Administered 2021-02-06: 12.5 mg via INTRAVENOUS

## 2021-02-06 MED ORDER — COCONUT OIL OIL
1.0000 | TOPICAL_OIL | Status: DC | PRN
Start: 2021-02-06 — End: 2021-02-08
  Administered 2021-02-06: 1 via TOPICAL

## 2021-02-06 MED ORDER — HYDROMORPHONE HCL 1 MG/ML IJ SOLN: 0.2500 mg | INTRAMUSCULAR | Status: DC | PRN

## 2021-02-06 MED ORDER — CLINDAMYCIN PHOSPHATE 900 MG/50ML IV SOLN
INTRAVENOUS | Status: AC
  Filled 2021-02-06: qty 50

## 2021-02-06 MED ORDER — ONDANSETRON HCL 4 MG/2ML IJ SOLN
INTRAMUSCULAR | Status: AC
  Filled 2021-02-06: qty 2

## 2021-02-06 MED ORDER — PHENYLEPHRINE HCL (PRESSORS) 10 MG/ML IV SOLN
INTRAVENOUS | Status: DC | PRN
  Administered 2021-02-06 (×3): 80 ug via INTRAVENOUS

## 2021-02-06 MED ORDER — SIMETHICONE 80 MG PO CHEW
80.0000 mg | CHEWABLE_TABLET | ORAL | Status: DC | PRN
  Administered 2021-02-08: 80 mg via ORAL
  Filled 2021-02-06: qty 1

## 2021-02-06 MED ORDER — DIPHENHYDRAMINE HCL 50 MG/ML IJ SOLN: 12.5000 mg | INTRAMUSCULAR | Status: DC | PRN

## 2021-02-06 MED ORDER — METRONIDAZOLE 500 MG PO TABS
500.0000 mg | ORAL_TABLET | Freq: Two times a day (BID) | ORAL | Status: DC
  Administered 2021-02-06 (×2): 500 mg via ORAL
  Filled 2021-02-06 (×3): qty 1

## 2021-02-06 MED ORDER — DEXAMETHASONE SODIUM PHOSPHATE 4 MG/ML IJ SOLN
INTRAMUSCULAR | Status: AC
  Filled 2021-02-06: qty 1

## 2021-02-06 MED ORDER — ACETAMINOPHEN 10 MG/ML IV SOLN
INTRAVENOUS | Status: DC | PRN
  Administered 2021-02-06: 1000 mg via INTRAVENOUS

## 2021-02-06 MED ORDER — OXYCODONE HCL 5 MG/5ML PO SOLN: 5.0000 mg | Freq: Once | ORAL | Status: DC | PRN

## 2021-02-06 MED ORDER — SENNOSIDES-DOCUSATE SODIUM 8.6-50 MG PO TABS
2.0000 | ORAL_TABLET | ORAL | Status: DC
  Administered 2021-02-06 – 2021-02-08 (×3): 2 via ORAL
  Filled 2021-02-06 (×3): qty 2

## 2021-02-06 MED ORDER — LACTATED RINGERS IV SOLN: INTRAVENOUS | Status: DC

## 2021-02-06 MED ORDER — NALBUPHINE HCL 10 MG/ML IJ SOLN: 5.0000 mg | Freq: Once | INTRAMUSCULAR | Status: DC | PRN

## 2021-02-06 MED ORDER — SIMETHICONE 80 MG PO CHEW
80.0000 mg | CHEWABLE_TABLET | Freq: Three times a day (TID) | ORAL | Status: DC
  Administered 2021-02-06 – 2021-02-08 (×6): 80 mg via ORAL
  Filled 2021-02-06 (×6): qty 1

## 2021-02-06 MED ORDER — CLINDAMYCIN PHOSPHATE 900 MG/50ML IV SOLN
900.0000 mg | INTRAVENOUS | Status: AC
  Administered 2021-02-06: 900 mg via INTRAVENOUS

## 2021-02-06 MED ORDER — GENTAMICIN SULFATE 40 MG/ML IJ SOLN
5.0000 mg/kg | INTRAVENOUS | Status: AC
  Administered 2021-02-06: 320 mg via INTRAVENOUS
  Filled 2021-02-06: qty 8

## 2021-02-06 MED ORDER — MENTHOL 3 MG MT LOZG: 1.0000 | LOZENGE | OROMUCOSAL | Status: DC | PRN

## 2021-02-06 MED ORDER — OXYTOCIN-SODIUM CHLORIDE 30-0.9 UT/500ML-% IV SOLN
2.5000 [IU]/h | INTRAVENOUS | Status: AC
  Administered 2021-02-06: 2.5 [IU]/h via INTRAVENOUS
  Filled 2021-02-06: qty 500

## 2021-02-06 MED ORDER — PROMETHAZINE HCL 25 MG/ML IJ SOLN
INTRAMUSCULAR | Status: AC
  Filled 2021-02-06: qty 1

## 2021-02-06 MED ORDER — MEPERIDINE HCL 25 MG/ML IJ SOLN: 6.2500 mg | INTRAMUSCULAR | Status: DC | PRN

## 2021-02-06 MED ORDER — KETOROLAC TROMETHAMINE 30 MG/ML IJ SOLN
INTRAMUSCULAR | Status: AC
  Filled 2021-02-06: qty 1

## 2021-02-06 MED ORDER — PHENYLEPHRINE HCL-NACL 20-0.9 MG/250ML-% IV SOLN
INTRAVENOUS | Status: DC | PRN
  Administered 2021-02-06: 60 ug/min via INTRAVENOUS

## 2021-02-06 MED ORDER — DIPHENHYDRAMINE HCL 25 MG PO CAPS: 25.0000 mg | ORAL_CAPSULE | Freq: Four times a day (QID) | ORAL | Status: DC | PRN

## 2021-02-06 MED ORDER — POVIDONE-IODINE 10 % EX SWAB
2.0000 "application " | Freq: Once | CUTANEOUS | Status: AC
  Administered 2021-02-06: 2 via TOPICAL

## 2021-02-06 MED ORDER — FENTANYL CITRATE (PF) 100 MCG/2ML IJ SOLN
INTRAMUSCULAR | Status: DC | PRN
  Administered 2021-02-06: 15 ug via INTRATHECAL

## 2021-02-06 MED ORDER — DIPHENHYDRAMINE HCL 25 MG PO CAPS: 25.0000 mg | ORAL_CAPSULE | ORAL | Status: DC | PRN

## 2021-02-06 MED ORDER — ONDANSETRON HCL 4 MG/2ML IJ SOLN: 4.0000 mg | Freq: Three times a day (TID) | INTRAMUSCULAR | Status: DC | PRN

## 2021-02-06 MED ORDER — OXYTOCIN-SODIUM CHLORIDE 30-0.9 UT/500ML-% IV SOLN
INTRAVENOUS | Status: DC | PRN
  Administered 2021-02-06: 150 mL via INTRAVENOUS

## 2021-02-06 MED ORDER — ONDANSETRON HCL 4 MG/2ML IJ SOLN
INTRAMUSCULAR | Status: DC | PRN
  Administered 2021-02-06: 4 mg via INTRAVENOUS

## 2021-02-06 MED ORDER — SCOPOLAMINE 1 MG/3DAYS TD PT72: 1.0000 | MEDICATED_PATCH | Freq: Once | TRANSDERMAL | Status: DC

## 2021-02-06 MED ORDER — SODIUM CHLORIDE 0.9% FLUSH: 3.0000 mL | INTRAVENOUS | Status: DC | PRN

## 2021-02-06 MED ORDER — SCOPOLAMINE 1 MG/3DAYS TD PT72
MEDICATED_PATCH | TRANSDERMAL | Status: AC
  Filled 2021-02-06: qty 1

## 2021-02-06 MED ORDER — TETANUS-DIPHTH-ACELL PERTUSSIS 5-2.5-18.5 LF-MCG/0.5 IM SUSY: 0.5000 mL | PREFILLED_SYRINGE | Freq: Once | INTRAMUSCULAR | Status: DC

## 2021-02-06 MED ORDER — PHENYLEPHRINE HCL-NACL 20-0.9 MG/250ML-% IV SOLN
INTRAVENOUS | Status: AC
  Filled 2021-02-06: qty 250

## 2021-02-06 MED ORDER — DEXAMETHASONE SODIUM PHOSPHATE 4 MG/ML IJ SOLN
INTRAMUSCULAR | Status: DC | PRN
  Administered 2021-02-06: 5 mg via INTRAVENOUS

## 2021-02-06 MED ORDER — SODIUM CHLORIDE 0.9 % IR SOLN
Status: DC | PRN
  Administered 2021-02-06: 1000 mL

## 2021-02-06 MED ORDER — KETOROLAC TROMETHAMINE 30 MG/ML IJ SOLN
30.0000 mg | Freq: Four times a day (QID) | INTRAMUSCULAR | Status: DC | PRN
  Administered 2021-02-06 – 2021-02-07 (×3): 30 mg via INTRAVENOUS
  Filled 2021-02-06 (×3): qty 1

## 2021-02-06 MED ORDER — WITCH HAZEL-GLYCERIN EX PADS: 1.0000 "application " | MEDICATED_PAD | CUTANEOUS | Status: DC | PRN

## 2021-02-06 MED ORDER — OXYCODONE-ACETAMINOPHEN 5-325 MG PO TABS
1.0000 | ORAL_TABLET | ORAL | Status: DC | PRN
Start: 2021-02-06 — End: 2021-02-07

## 2021-02-06 MED ORDER — MORPHINE SULFATE (PF) 0.5 MG/ML IJ SOLN
INTRAMUSCULAR | Status: AC
  Filled 2021-02-06: qty 10

## 2021-02-06 MED ORDER — PRENATAL MULTIVITAMIN CH
1.0000 | ORAL_TABLET | Freq: Every day | ORAL | Status: DC
  Administered 2021-02-07 – 2021-02-08 (×2): 1 via ORAL
  Filled 2021-02-06 (×2): qty 1

## 2021-02-06 MED ORDER — ZOLPIDEM TARTRATE 5 MG PO TABS: 5.0000 mg | ORAL_TABLET | Freq: Every evening | ORAL | Status: DC | PRN

## 2021-02-06 MED ORDER — KETOROLAC TROMETHAMINE 30 MG/ML IJ SOLN
30.0000 mg | Freq: Once | INTRAMUSCULAR | Status: AC | PRN
  Administered 2021-02-06: 30 mg via INTRAVENOUS

## 2021-02-06 MED ORDER — FENTANYL CITRATE (PF) 100 MCG/2ML IJ SOLN
INTRAMUSCULAR | Status: AC
  Filled 2021-02-06: qty 2

## 2021-02-06 MED ORDER — DIBUCAINE (PERIANAL) 1 % EX OINT: 1.0000 "application " | TOPICAL_OINTMENT | CUTANEOUS | Status: DC | PRN

## 2021-02-06 MED ORDER — SCOPOLAMINE 1 MG/3DAYS TD PT72
MEDICATED_PATCH | TRANSDERMAL | Status: DC | PRN
  Administered 2021-02-06: 1 via TRANSDERMAL

## 2021-02-06 SURGICAL SUPPLY — 36 items
BENZOIN TINCTURE PRP APPL 2/3 (GAUZE/BANDAGES/DRESSINGS) ×2 IMPLANT
CHLORAPREP W/TINT 26ML (MISCELLANEOUS) ×2 IMPLANT
CLAMP CORD UMBIL (MISCELLANEOUS) IMPLANT
CLOSURE STERI STRIP 1/2 X4 (GAUZE/BANDAGES/DRESSINGS) ×2 IMPLANT
CLOTH BEACON ORANGE TIMEOUT ST (SAFETY) ×2 IMPLANT
DRAIN JACKSON PRT FLT 10 (DRAIN) IMPLANT
DRSG OPSITE POSTOP 4X10 (GAUZE/BANDAGES/DRESSINGS) ×2 IMPLANT
ELECT REM PT RETURN 9FT ADLT (ELECTROSURGICAL) ×2
ELECTRODE REM PT RTRN 9FT ADLT (ELECTROSURGICAL) ×1 IMPLANT
EVACUATOR SILICONE 100CC (DRAIN) IMPLANT
EXTRACTOR VACUUM M CUP 4 TUBE (SUCTIONS) IMPLANT
GLOVE BIO SURGEON STRL SZ 6.5 (GLOVE) ×2 IMPLANT
GLOVE BIOGEL PI IND STRL 7.0 (GLOVE) ×2 IMPLANT
GLOVE BIOGEL PI INDICATOR 7.0 (GLOVE) ×2
GOWN STRL REUS W/TWL LRG LVL3 (GOWN DISPOSABLE) ×4 IMPLANT
KIT ABG SYR 3ML LUER SLIP (SYRINGE) IMPLANT
NEEDLE HYPO 25X5/8 SAFETYGLIDE (NEEDLE) IMPLANT
NS IRRIG 1000ML POUR BTL (IV SOLUTION) ×2 IMPLANT
PACK C SECTION WH (CUSTOM PROCEDURE TRAY) ×2 IMPLANT
PAD OB MATERNITY 4.3X12.25 (PERSONAL CARE ITEMS) ×2 IMPLANT
PENCIL SMOKE EVAC W/HOLSTER (ELECTROSURGICAL) ×2 IMPLANT
RTRCTR C-SECT PINK 25CM LRG (MISCELLANEOUS) IMPLANT
STRIP CLOSURE SKIN 1/2X4 (GAUZE/BANDAGES/DRESSINGS) ×2 IMPLANT
SUT CHROMIC 0 CT 1 (SUTURE) ×2 IMPLANT
SUT MNCRL AB 3-0 PS2 27 (SUTURE) ×2 IMPLANT
SUT PLAIN 2 0 (SUTURE) ×4
SUT PLAIN 2 0 XLH (SUTURE) ×2 IMPLANT
SUT PLAIN ABS 2-0 CT1 27XMFL (SUTURE) ×2 IMPLANT
SUT SILK 2 0 SH (SUTURE) IMPLANT
SUT VIC AB 0 CTX 36 (SUTURE) ×8
SUT VIC AB 0 CTX36XBRD ANBCTRL (SUTURE) ×4 IMPLANT
SUT VIC AB 2-0 SH 27 (SUTURE)
SUT VIC AB 2-0 SH 27XBRD (SUTURE) IMPLANT
TOWEL OR 17X24 6PK STRL BLUE (TOWEL DISPOSABLE) ×2 IMPLANT
TRAY FOLEY W/BAG SLVR 14FR LF (SET/KITS/TRAYS/PACK) ×2 IMPLANT
WATER STERILE IRR 1000ML POUR (IV SOLUTION) ×2 IMPLANT

## 2021-02-06 NOTE — Anesthesia Postprocedure Evaluation (Signed)
Anesthesia Post Note  Patient: Theresa Shepherd  Procedure(s) Performed: CESAREAN SECTION (N/A Abdomen)     Patient location during evaluation: PACU Anesthesia Type: Spinal Level of consciousness: awake and alert Pain management: pain level controlled Vital Signs Assessment: post-procedure vital signs reviewed and stable Respiratory status: spontaneous breathing, nonlabored ventilation and respiratory function stable Cardiovascular status: blood pressure returned to baseline and stable Postop Assessment: no apparent nausea or vomiting Anesthetic complications: no   No complications documented.  Last Vitals:  Vitals:   02/06/21 1145 02/06/21 1200  BP: 121/83 127/90  Pulse: (!) 104 67  Resp: 16 16  Temp: 36.4 C 36.4 C  SpO2: 99% 99%    Last Pain:  Vitals:   02/06/21 1200  TempSrc: Oral  PainSc:    Pain Goal:    LLE Motor Response: Purposeful movement (02/06/21 1145)   RLE Motor Response: Purposeful movement (02/06/21 1145)       Epidural/Spinal Function Cutaneous sensation: Able to Discern Pressure (02/06/21 1145), Patient able to flex knees: Yes (02/06/21 1145), Patient able to lift hips off bed: No (02/06/21 1145), Back pain beyond tenderness at insertion site: No (02/06/21 1145), Progressively worsening motor and/or sensory loss: No (02/06/21 1145), Bowel and/or bladder incontinence post epidural: No (02/06/21 1145)  Lowella Curb

## 2021-02-06 NOTE — Anesthesia Procedure Notes (Signed)
Spinal  Patient location during procedure: OB Start time: 02/06/2021 9:37 AM End time: 02/06/2021 9:42 AM Reason for block: surgical anesthesia Staffing Performed: anesthesiologist  Anesthesiologist: Lowella Curb, MD Preanesthetic Checklist Completed: patient identified, IV checked, risks and benefits discussed, surgical consent, monitors and equipment checked, pre-op evaluation and timeout performed Spinal Block Patient position: sitting Prep: DuraPrep and site prepped and draped Patient monitoring: heart rate, cardiac monitor, continuous pulse ox and blood pressure Approach: midline Location: L3-4 Injection technique: single-shot Needle Needle type: Pencan  Needle gauge: 24 G Needle length: 10 cm Assessment Sensory level: T4 Events: CSF return

## 2021-02-06 NOTE — Op Note (Signed)
Cesarean Section Procedure Note   Theresa Shepherd  02/06/2021  Indications: Scheduled Proceedure/Maternal Request   Pre-operative Diagnosis: desires repeat cesarean section.   Post-operative Diagnosis: Same   Surgeon: Surgeon(s) and Role:    * Jaymes Graff, MD - Primary   Assistants: Nigel Bridgeman CNM   Anesthesia: spinal   Procedure Details:  The patient was seen in the Holding Room. The risks, benefits, complications, treatment options, and expected outcomes were discussed with the patient. The patient concurred with the proposed plan, giving informed consent. identified as [name] and the procedure verified as C-Section Delivery. A Time Out was held and the above information confirmed.  After induction of anesthesia, the patient was draped and prepped in the usual sterile manner. A transverse incision was made and carried down through the subcutaneous tissue to the fascia. Fascial incision was made in the midline and extended transversely. The fascia was separated from the underlying rectus muscle superiorly and inferiorly. The peritoneum was identified and entered. Peritoneal incision was extended longitudinally with good visualization of bowel and bladder. The utero-vesical peritoneal reflection was incised transversely and the bladder flap was bluntly freed from the lower uterine segment.  An alexsis retractor was placed in the abdomen.   A low transverse uterine incision was made. Delivered from cephalic presentation was a  infant, with Apgar scores of not documented yet at one minute and not documented yet at five minutes. Cord ph was not sent the umbilical cord was clamped and cut cord blood was obtained for evaluation. The placenta was removed Intact and appeared normal. The uterine outline, tubes and ovaries appeared normal}. The uterine incision was closed with running locked sutures of 0Vicryl. A second layer 0 vicrlyl was used to imbricate the uterine incision    Hemostasis  was observed. Lavage was carried out until clear. The alexsis was removed.  The peritoneum was closed with 0 chromic.  The muscles were examined and any bleeders were made hemostatic using bovie cautery device.   The fascia was then reapproximated with running sutures of 0 vicryl.  The subcutaneous tissue was reapproximated  With interrupted stitches using 2-0 plain gut. The subcuticular closure was performed using 3-100monocryl     Instrument, sponge, and needle counts were correct prior the abdominal closure and were correct at the conclusion of the case.    Findings: infant was delivered from vtx presentation. The fluid was meconium stained.  The uterus tubes and ovaries appeared normal.     Estimated Blood Loss: 352cc   Total IV Fluids:   Urine Output: 100CC OF clear urine  Specimens: placenta to pathology  Complications: no complications  Disposition: PACU - hemodynamically stable.   Maternal Condition: stable   Baby condition / location:  Couplet care / Skin to Skin  Attending Attestation: I performed the procedure.   Signed: Surgeon(s): Jaymes Graff, MD

## 2021-02-06 NOTE — Lactation Note (Signed)
This note was copied from a baby's chart. Lactation Consultation Note  Patient Name: Theresa Shepherd Date: 02/06/2021 Reason for consult: Initial assessment;1st time breastfeeding Age:34 hours  LC student entered the room; baby was in mom's arms. Mom was tired from the surgery; LC Student discussed breastfeeding basics, such as STS, hand expression, and how often to feed baby.   When reviewing hand expression, mom has great, compressible tissue. Mom hand expressed 2 mLs on her own; colostrum was immediately fed back to baby. [facility] student advised to use hand expression to move colostrum during this time if baby is not cueing or latching.  [facility] Student also discussed feeding cues and to feed baby 8-12x/24 hrs using this method  Discussed outpatient services and lactation support that can be available .   Feeding Plan STS as much as possible Hand expression to spoon feed baby 8-12x/24 hrs by hunger cues Practice latching baby when cueing occurs  Maternal Data Has patient been taught Hand Expression?: Yes Does the patient have breastfeeding experience prior to this delivery?: No  Feeding Mother's Current Feeding Choice: Breast Milk  LATCH Score                    Lactation Tools Discussed/Used    Interventions    Discharge Pump: Personal WIC Program: Yes  Consult Status      Arne Schlender 02/06/2021, 1:05 PM

## 2021-02-06 NOTE — Lactation Note (Signed)
This note was copied from a baby's chart. Lactation Consultation Note  Patient Name: Theresa Shepherd [id number] Date: 02/06/2021 Reason for consult: Follow-up assessment;Mother's request;Difficult latch;Term;Maternal endocrine disorder Age: 8hrs  Mom stated having trouble latching infant on the left breasts. Mom left breast soft and compressible, nipple will erect with stimulation. LC attempted to latch with tea cup hold but infant not able to sustain it.   LC did some suck training. Infant had high palate, with suck training he can extend the tongue pass the gum line.   LC used 20 NS and then increased to 24 NS to get infant to latch. Infant latched for about 12 minutes came off still showing cues. LC hand expressed 2 ml. LC showed Mom how to relatch with tea cup hold. Infant latch with signs of milk transfer and still feeding at the end of the visit.   Mom given coconut oil for nipple care. Mom to pre pump with DEBP to extend her nipple 5-10 minutes.   Plan 1. To feed based on cues 8-12x in 24hr period, no more than 4 hrs without an attempt. Mom to offer both breasts if needed use 24 NS for left breasts.           2. Mom to offer EBM via spoon or finger feeding if infant still cueing.            3. Mom to pump using DEBP q 3 hrs for 15 minutes.   LC reviewed how to chart feedings, urine and stool output and I/O sheet.  All questions answered at the end of the visit.   Maternal Data    Feeding Mother's Current Feeding Choice: Breast Milk  LATCH Score Latch: Repeated attempts needed to sustain latch, nipple held in mouth throughout feeding, stimulation needed to elicit sucking reflex.  Audible Swallowing: Spontaneous and intermittent  Type of Nipple: Everted at rest and after stimulation (breasts are soft and compressible but erect)  Comfort (Breast/Nipple): Soft / non-tender  Hold (Positioning): Assistance needed to correctly position infant at breast and maintain latch.  LATCH  Score: 8   Lactation Tools Discussed/Used Tools: Pump;Flanges;Nipple Shields Nipple shield size: 24 (Infant struggled to latch on left breast, NS 24 used.) Flange Size: 27 Breast pump type: Double-Electric Breast Pump Pump Education: Setup, frequency, and cleaning;Milk Storage Reason for Pumping: increase stimulation / pre pumping to bring out nipples Pumping frequency: every 3 hrs for 15 minutes  Interventions Interventions: Breast feeding basics reviewed;Assisted with latch;Skin to skin;Support pillows;DEBP;Adjust position;Breast compression;Breast massage;Coconut oil;Pre-pump if needed;Hand express;Expressed milk;Education;Position options;Reverse pressure  Discharge Pump: Personal WIC Program: Yes (Mom to call WIC in the morning)  Consult Status Consult Status: Follow-up Date: 02/07/21 Follow-up type: In-patient    Alvar Malinoski  Nicholson-Springer 02/06/2021, 7:44 PM

## 2021-02-06 NOTE — Transfer of Care (Signed)
Immediate Anesthesia Transfer of Care Note  Patient: Theresa Shepherd  Procedure(s) Performed: CESAREAN SECTION (N/A Abdomen)  Patient Location: PACU  Anesthesia Type:Spinal  Level of Consciousness: awake, alert  and oriented  Airway & Oxygen Therapy: Patient Spontanous Breathing  Post-op Assessment: Report given to RN and Post -op Vital signs reviewed and stable  Post vital signs: Reviewed and stable  Last Vitals:  Vitals Value Taken Time  BP 130/85 02/06/21 1055  Temp    Pulse 76 02/06/21 1058  Resp 17 02/06/21 1058  SpO2 100 % 02/06/21 1058  Vitals shown include unvalidated device data.  Last Pain:  Vitals:   02/06/21 0812  TempSrc: Oral         Complications: No complications documented.

## 2021-02-06 NOTE — Lactation Note (Signed)
This note was copied from a baby's chart. Lactation Consultation Note  Patient Name: Theresa Shepherd [id number] Date: 02/06/2021 Reason for consult: Follow-up assessment Age:34 hours   Infant cueing when LC entered room.  Assisted with latching in football hold.  Infant latched after a few attempts.  Gape wide and rhythmic sucking noted.  Mom was excited to have infant feeding at the breast.    Reviewed basics while in room, infant fed for 5 minutes and was still feeding when LC left room.   Maternal Data Has patient been taught Hand Expression?: Yes Does the patient have breastfeeding experience prior to this delivery?: No  Feeding Mother's Current Feeding Choice: Breast Milk  LATCH Score Latch: Repeated attempts needed to sustain latch, nipple held in mouth throughout feeding, stimulation needed to elicit sucking reflex.  Audible Swallowing: A few with stimulation  Type of Nipple: Everted at rest and after stimulation  Comfort (Breast/Nipple): Soft / non-tender  Hold (Positioning): Assistance needed to correctly position infant at breast and maintain latch.  LATCH Score: 7   Lactation Tools Discussed/Used    Interventions Interventions: Breast feeding basics reviewed;Assisted with latch;Breast massage;Hand express  Discharge Pump: Personal WIC Program: Yes  Consult Status Consult Status: Follow-up Date: 02/07/21 Follow-up type: In-patient    Maryruth Hancock [facility] 02/06/2021, 1:16 PM

## 2021-02-06 NOTE — Anesthesia Preprocedure Evaluation (Signed)
Anesthesia Evaluation  Patient identified by MRN, date of birth, ID band Patient awake    Reviewed: Allergy & Precautions, NPO status , Patient's Chart, lab work & pertinent test results  Airway Mallampati: II  TM Distance: >3 FB Neck ROM: Full    Dental no notable dental hx.    Pulmonary asthma ,    Pulmonary exam normal breath sounds clear to auscultation       Cardiovascular negative cardio ROS Normal cardiovascular exam Rhythm:Regular Rate:Normal     Neuro/Psych negative neurological ROS  negative psych ROS   GI/Hepatic negative GI ROS, Neg liver ROS,   Endo/Other  negative endocrine ROSdiabetes, Gestational  Renal/GU negative Renal ROS  negative genitourinary   Musculoskeletal negative musculoskeletal ROS (+)   Abdominal   Peds negative pediatric ROS (+)  Hematology negative hematology ROS (+)   Anesthesia Other Findings   Reproductive/Obstetrics (+) Pregnancy                             Anesthesia Physical Anesthesia Plan  ASA: III  Anesthesia Plan: Spinal   Post-op Pain Management:    Induction:   PONV Risk Score and Plan: 2 and Ondansetron, Midazolam and Treatment may vary due to age or medical condition  Airway Management Planned: Natural Airway  Additional Equipment:   Intra-op Plan:   Post-operative Plan:   Informed Consent: I have reviewed the patients History and Physical, chart, labs and discussed the procedure including the risks, benefits and alternatives for the proposed anesthesia with the patient or authorized representative who has indicated his/her understanding and acceptance.     Dental advisory given  Plan Discussed with: CRNA  Anesthesia Plan Comments:         Anesthesia Quick Evaluation

## 2021-02-07 LAB — CBC
HCT: 24.7 % — ABNORMAL LOW (ref 36.0–46.0)
Hemoglobin: 8.6 g/dL — ABNORMAL LOW (ref 12.0–15.0)
MCH: 30.8 pg (ref 26.0–34.0)
MCHC: 34.8 g/dL (ref 30.0–36.0)
MCV: 88.5 fL (ref 80.0–100.0)
Platelets: 188 10*3/uL (ref 150–400)
RBC: 2.79 MIL/uL — ABNORMAL LOW (ref 3.87–5.11)
RDW: 12.9 % (ref 11.5–15.5)
WBC: 12.8 10*3/uL — ABNORMAL HIGH (ref 4.0–10.5)
nRBC: 0 % (ref 0.0–0.2)

## 2021-02-07 LAB — GLUCOSE, CAPILLARY: Glucose-Capillary: 77 mg/dL (ref 70–99)

## 2021-02-07 MED ORDER — POLYSACCHARIDE IRON COMPLEX 150 MG PO CAPS
150.0000 mg | ORAL_CAPSULE | Freq: Every day | ORAL | Status: DC
  Administered 2021-02-07 – 2021-02-08 (×2): 150 mg via ORAL
  Filled 2021-02-07 (×2): qty 1

## 2021-02-07 MED ORDER — IBUPROFEN 600 MG PO TABS
600.0000 mg | ORAL_TABLET | Freq: Four times a day (QID) | ORAL | Status: DC
  Administered 2021-02-07 – 2021-02-08 (×5): 600 mg via ORAL
  Filled 2021-02-07 (×5): qty 1

## 2021-02-07 MED ORDER — ACETAMINOPHEN 325 MG PO TABS
650.0000 mg | ORAL_TABLET | Freq: Four times a day (QID) | ORAL | Status: DC | PRN
  Administered 2021-02-07 – 2021-02-08 (×3): 650 mg via ORAL
  Filled 2021-02-07 (×3): qty 2

## 2021-02-07 MED ORDER — OXYCODONE HCL 5 MG PO TABS
5.0000 mg | ORAL_TABLET | Freq: Four times a day (QID) | ORAL | Status: DC | PRN
  Administered 2021-02-07: 10 mg via ORAL
  Administered 2021-02-07: 5 mg via ORAL
  Administered 2021-02-08: 10 mg via ORAL
  Administered 2021-02-08: 5 mg via ORAL
  Filled 2021-02-07: qty 2
  Filled 2021-02-07: qty 1
  Filled 2021-02-07 (×2): qty 2

## 2021-02-07 NOTE — Lactation Note (Signed)
This note was copied from a baby's chart. Lactation Consultation Note Baby 19 hrs old. Mom called out d/t unable to latch to Lt. Breast Rt. Nipple is sore. Mom's plan for when she goes home is to pump and bottle feed. Mom plans to latch while at hospital. Mom pumped and bottle fed her first child.  Mom stated that the pump hurt her nipple and she didn't get anything, she got more out by hand expression.  Lt. Breast w/edema. Not compressible. LC used reverse pressure and massage to soften breast tissue. LC collected 8 ml. Mom stated she didn't want to get the baby confused by giving nipples. LC gave mom spoon to spoon feed with.  When LC finished w/breast massage and reverse pressure breast tissue to Lt. Breast had made tissue compressible for latching.  Encouraged mom to wear shells this am. Demonstrated to mom how to wear them. Encouraged to call for assistance or questions.   Patient Name: Theresa Shepherd RCVEL'F Date: 02/07/2021 Reason for consult: Mother's request;Term Age:34 hours  Maternal Data Has patient been taught Hand Expression?: Yes  Feeding    LATCH Score Latch: Grasps breast easily, tongue down, lips flanged, rhythmical sucking.  Audible Swallowing: Spontaneous and intermittent  Type of Nipple: Flat  Comfort (Breast/Nipple): Filling, red/small blisters or bruises, mild/mod discomfort (edema)  Hold (Positioning): Assistance needed to correctly position infant at breast and maintain latch.  LATCH Score: 7   Lactation Tools Discussed/Used Tools: Shells;Pump  Interventions Interventions: Breast feeding basics reviewed;Support pillows;Skin to skin;Breast massage;Hand express;Shells;Pre-pump if needed;Reverse pressure;Breast compression  Discharge    Consult Status Consult Status: Follow-up Date: 02/07/21 (pm) Follow-up type: In-patient    Charyl Dancer 02/07/2021, 5:13 AM

## 2021-02-07 NOTE — Progress Notes (Signed)
Theresa Shepherd [id number] Postpartum Postoperative Day # 1  Theresa Shepherd, [id number], [redacted]w[redacted]d, S/P Repeat LT Cesarean Section due to Elective with DR Dillard on 4/5. GDMA1, stable CBG, PP 80-96. QBL . HGB 11.5-8.6, asymptomatic, started on PO Iron. Complete 7 days course flagyl for BV dx on 3/31.  Subjective: Patient up ad lib, denies syncope or dizziness. Reports consuming regular diet without issues and denies N/V. Patient reports 0 bowel movement + passing flatus.  Denies issues with urination and reports bleeding is "lighter."  Patient is breast and bottle feeding and reports going well.  Desires nexplanon for postpartum contraception.  Pain is being appropriately managed with use of po meds.     Objective: Patient Vitals for the past 24 hrs:  BP Temp Temp src Pulse Resp SpO2  02/07/21 0544 121/70 98 F (36.7 C) Oral 79 18 --  02/07/21 0115 118/81 98 F (36.7 C) Oral 63 16 --  02/06/21 2113 108/68 98.2 F (36.8 C) Oral 64 18 --  02/06/21 1711 114/63 98.7 F (37.1 C) Oral 60 18 97 %  02/06/21 1545 113/63 97.7 F (36.5 C) -- 71 17 98 %  02/06/21 1411 118/73 97.7 F (36.5 C) Oral 62 16 100 %  02/06/21 1300 (!) 118/98 97.8 F (36.6 C) Oral 75 17 99 %  02/06/21 1200 127/90 97.6 F (36.4 C) Oral 67 16 99 %  02/06/21 1145 121/83 97.6 F (36.4 C) Oral (!) 104 16 99 %  02/06/21 1130 121/87 97.6 F (36.4 C) Oral 74 15 100 %  02/06/21 1115 -- -- -- 73 (!) 24 100 %  02/06/21 1100 119/81 (!) 97.4 F (36.3 C) -- 67 16 100 %     Physical Exam:  General: alert, cooperative, appears stated age and no distress Mood/Affect: Happy Lungs: clear to auscultation, no wheezes, rales or rhonchi, symmetric air entry.  Heart: normal rate, regular rhythm, normal S1, S2, no murmurs, rubs, clicks or gallops. Breast: breasts appear normal, no suspicious masses, no skin or nipple changes or axillary nodes. Abdomen:  + bowel sounds, soft, non-tender Incision: healing well, no significant drainage, no  dehiscence, no significant erythema, Honeycomb dressing  Uterine Fundus: firm, involution -1 Lochia: appropriate Skin: Warm, Dry. DVT Evaluation: No evidence of DVT seen on physical exam. Negative Homan's sign. No cords or calf tenderness. No significant calf/ankle edema.  Labs: Recent Labs    02/05/21 0937 02/07/21 0510  HGB 11.5* 8.6*  HCT 33.7* 24.7*  WBC 9.7 12.8*    CBG (last 3)  Recent Labs    02/06/21 0817 02/06/21 1106  GLUCAP 80 96     I/O: I/O last 3 completed shifts: In: 2124.4 [P.O.:720; I.V.:1188.4; IV Piggyback:216] Out: 1277 [Urine:925; Blood:352]   Assessment Postpartum Postoperative Day # 1  Theresa Shepherd, [id number], [redacted]w[redacted]d, S/P Repeat LT Cesarean Section due to Elective with DR Dillard on 4/5. GDMA1, stable CBG, PP 80-96. QBL . HGB 11.5-8.6, asymptomatic, started on PO Iron.  Pt stable. -1 Involution. Breast and bottle Feeding. Hemodynamically Stable. Complete 7 days course flagyl for BV dx on 3/31.  Plan: Continue other mgmt as ordered H/O BV: D/c falgyl, pt received completion of 7th day flagyl yesterday Anemai: PO Iron.  GDMA1: No fasting was performed today, order placed for fastin CBG in morning.  VTE Prophylactics: SCD, ambulated as tolerates.  Pain control: Motrin/Tylenol/Narcotics PRN Education given regarding options for contraception, including barrier methods, injectable contraception, IUD placement, oral contraceptives.  Plan for discharge tomorrow, Breastfeeding, Circumcision  prior to discharge and Contraception nexplanon at 6 weeks PPV  Dr. Normand Sloop to be updated on patient status  [facility] NP-C, CNM 02/07/2021, 8:31 AM

## 2021-02-08 DIAGNOSIS — Z98891 History of uterine scar from previous surgery: Secondary | ICD-10-CM | POA: Diagnosis not present

## 2021-02-08 DIAGNOSIS — Z3A39 39 weeks gestation of pregnancy: Secondary | ICD-10-CM | POA: Diagnosis not present

## 2021-02-08 DIAGNOSIS — O34211 Maternal care for low transverse scar from previous cesarean delivery: Secondary | ICD-10-CM | POA: Diagnosis not present

## 2021-02-08 LAB — GLUCOSE, CAPILLARY: Glucose-Capillary: 128 mg/dL — ABNORMAL HIGH (ref 70–99)

## 2021-02-08 LAB — SURGICAL PATHOLOGY

## 2021-02-08 MED ORDER — IBUPROFEN 600 MG PO TABS: 600.0000 mg | ORAL_TABLET | Freq: Four times a day (QID) | ORAL | 0 refills | Status: DC

## 2021-02-08 MED ORDER — ACETAMINOPHEN 325 MG PO TABS: 650.0000 mg | ORAL_TABLET | Freq: Four times a day (QID) | ORAL | Status: DC | PRN

## 2021-02-08 MED ORDER — OXYCODONE HCL 5 MG PO TABS: 5.0000 mg | ORAL_TABLET | Freq: Four times a day (QID) | ORAL | 0 refills | Status: DC | PRN

## 2021-02-08 MED ORDER — POLYSACCHARIDE IRON COMPLEX 150 MG PO CAPS: 150.0000 mg | ORAL_CAPSULE | Freq: Every day | ORAL | 0 refills | Status: DC

## 2021-02-08 NOTE — Discharge Summary (Signed)
Repeat Cesarean OB Discharge Summary  Patient Name: Theresa Shepherd DOB: [birth year] MRN: [id number]  Date of admission: 02/06/2021 Intrauterine pregnancy: [redacted]w[redacted]d   Admitting diagnosis: Encounter for maternal care for low transverse scar from repeat cesarean delivery [O34.211] S/P repeat low transverse C-section [Z98.891] Secondary diagnosis: None  Date of discharge: 02/08/2021    Discharge diagnosis: Term Pregnancy Delivered     Prenatal history: [id number]   EDC : 02/10/2021, Alternate EDD Entry  Prenatal care at [facility]  Primary provider : [facility] Prenatal course complicated by GDMA1  Prenatal Labs: ABO, Rh: --/--/O POS (04/04 0945) / Antibody: NEG (04/04 0945) Rubella: Immune (09/27 0000)  / RPR: NON REACTIVE (04/04 0937)  HBsAg: Negative (09/27 0000)  HIV: Non-reactive (09/27 0000)  GBS:                                      Hospital course:  Sceduled C/S   35 y.o. yo [id number] at [redacted]w[redacted]d was admitted to the hospital 02/06/2021 for scheduled cesarean section with the following indication:Elective Repeat.Delivery details are as follows:  Membrane Rupture Time/Date: 10:13 AM ,02/06/2021   Delivery Method:C-Section, Low Transverse  Details of operation can be found in separate operative note.  Patient had an uncomplicated postpartum course.  She is ambulating, tolerating a regular diet, passing flatus, and urinating well. Patient is discharged home in stable condition on  02/08/21        Newborn Data: Birth date:02/06/2021  Birth time:10:13 AM  Gender:Female  Living status:Living  Apgars:9 ,9  Weight:3925 g    Delivering PROVIDER: Jaymes Graff                                                            Complications: None  Newborn Data: Live born female  Birth Weight: 8 lb 10.5 oz (3925 g) APGAR: 9, 9  Newborn Delivery   Birth date/time: 02/06/2021 10:13:00 Delivery type: C-Section, Low Transverse Trial of labor: No C-section categorization: Repeat      Baby Feeding:  Breast Disposition:home with mother  Post partum procedures:Possible Nexplanon for pp [facility]  Labs: Lab Results  Component Value Date   WBC 12.8 (H) 02/07/2021   HGB 8.6 (L) 02/07/2021   HCT 24.7 (L) 02/07/2021   MCV 88.5 02/07/2021   PLT 188 02/07/2021   CMP Latest Ref Rng & Units 02/05/2021  Glucose 70 - 99 mg/dL 78  BUN 6 - 20 mg/dL 6  Creatinine 7.10 - 6.26 mg/dL 9.48  Sodium 546 - 270 mmol/L 137  Potassium 3.5 - 5.1 mmol/L 3.6  Chloride 98 - 111 mmol/L 106  CO2 22 - 32 mmol/L 22  Calcium 8.9 - 10.3 mg/dL 3.5(K)  Total Protein 6.5 - 8.1 g/dL -  Total Bilirubin 0.3 - 1.2 mg/dL -  Alkaline Phos 38 - 093 U/L -  AST 15 - 41 U/L -  ALT 0 - 44 U/L -    Physical Exam @ time of discharge:  Vitals:   02/07/21 0115 02/07/21 0544 02/07/21 2137 02/08/21 0530  BP: 118/81 121/70 118/68 125/75  Pulse: 63 79 67 65  Resp: 16 18 14 18   Temp: 98 F (36.7 C) 98 F (36.7 C) 97.7 F (  36.5 C) 97.8 F (36.6 C)  TempSrc: Oral Oral Oral Oral  SpO2:      Weight:      Height:       general: alert, cooperative and no distress lochia: appropriate uterine fundus: firm perineum: intact incision: Dressing is clean, dry, and intact extremities: DVT Evaluation: No evidence of DVT seen on physical exam. Negative Homan's sign. No cords or calf tenderness. No significant calf/ankle edema.  Discharge instructions:  "Baby and Me Booklet" and [facility] Booklet Discharge Medications:  Allergies as of 02/08/2021      Reactions   Augmentin [amoxicillin-pot Clavulanate] Hives      Medication List    STOP taking these medications   cyclobenzaprine 10 MG tablet Commonly known as: FLEXERIL   famotidine 20 MG tablet Commonly known as: Pepcid   ondansetron 8 MG disintegrating tablet Commonly known as: Zofran ODT     TAKE these medications   acetaminophen 325 MG tablet Commonly known as: TYLENOL Take 2 tablets (650 mg total) by mouth every 6 (six) hours as needed for moderate pain. What  changed:   medication strength  how much to take  reasons to take this   ibuprofen 600 MG tablet Commonly known as: ADVIL Take 1 tablet (600 mg total) by mouth every 6 (six) hours.   iron polysaccharides 150 MG capsule Commonly known as: NIFEREX Take 1 capsule (150 mg total) by mouth daily.   oxyCODONE 5 MG immediate release tablet Commonly known as: Oxy IR/ROXICODONE Take 1-2 tablets (5-10 mg total) by mouth every 6 (six) hours as needed for breakthrough pain.   PRENATAL VITAMINS PO Take 1 tablet by mouth every evening.            Discharge Care Instructions  (From admission, onward)         Start     Ordered   02/08/21 0000  If the dressing is still on your incision site when you go home, remove it on the third day after your surgery date. Remove dressing if it begins to fall off, or if it is dirty or damaged before the third day.        02/08/21 0826         Diet: routine diet Activity: Advance as tolerated. Pelvic rest x 6 weeks.  Follow up:1 week for incision check 4 weeks for pp visit Dr Sallye Ober aware of pt status and POC  Signed: Carollee Leitz MSN, CNM 02/08/2021, 8:26 AM

## 2021-02-08 NOTE — Lactation Note (Signed)
This note was copied from a baby's chart. Lactation Consultation Note  Patient Name: Theresa Shepherd [id number]'R Date: 02/08/2021 Reason for consult: Follow-up assessment Age:34 hours   Mother reports that infant has been fed 45 mls of ebm this last feeding . Mother is getting ready to pump. Mother has a Even Flow pump at home.  Mother reports that she plans to breast and bottle feed.  Discussed treatment and prevention of engorgement.  Plan of Care : Breastfeed infant with feeding cues Supplement infant with ebm/ according to supplemental guidelines. Pump using a DEBP after each feeding for 15-20 mins.   Mother to continue to cue base feed infant and feed at least 8-12 times or more in 24 hours and advised to allow for cluster feeding infant as needed.  Mother to continue to due STS. Mother is aware of available LC services at [facility], B[facility]'S, OP Dept, and phone # for questions or concerns about breastfeeding.  Mother receptive to all teaching and plan of care.    Maternal Data    Feeding Mother's Current Feeding Choice: Breast Milk  LATCH Score                    Lactation Tools Discussed/Used Pump Education: Setup, frequency, and cleaning;Milk Storage Pumping frequency: q 3 hours Pumped volume:  (45 mls)  Interventions    Discharge Pump: Manual;Personal  Consult Status Consult Status: Complete    Michel Bickers 02/08/2021, 10:36 AM

## 2021-02-08 NOTE — Lactation Note (Signed)
This note was copied from a baby's chart. Lactation Consultation Note Baby is 32 hrs old. Mom stated he is acting hungry all the time. Reminded mom of cluster feeding.  Baby did act as if he was starving. Also acted as if had nipple confusion. Nipple in mouth but would suck as if he couldn't feel it in his mouth. Mom hasn't given baby anything but the breast. Mom has been wearing shells to evert nipples more and remove edema. Mom is pumping. Mom pumped 30 minutes because she wanted to fill both little bottles up. Mom collected 50 ML. Encouraged mom just to pump 15-20 minutes. Discussed engorgement and pumping for relief.  Encouraged mom to give the baby some EBM. Mom has been giving it in a spoon. Gave mom foley cup to give it in also for measurements as well. Mom doesn't want to give nipples in fear of nipple confusion.  Baby has a lot of congestion. I could feel it in his back but his lungs were clear when the nurse listened. While feeding he was quiet, but when he wasn't on the breast he was very congested sounding.  Baby hasn't voided since after lunch.  LC wonders if baby is transferring milk well.  Will be watching his output tonight.   Patient Name: Theresa Shepherd OVFIE'P Date: 02/08/2021 Reason for consult: Mother's request;Follow-up assessment;Term Age:45 hours  Maternal Data    Feeding Mother's Current Feeding Choice: Breast Milk  LATCH Score Latch: Grasps breast easily, tongue down, lips flanged, rhythmical sucking.  Audible Swallowing: Spontaneous and intermittent  Type of Nipple: Flat (everted w/shells)  Comfort (Breast/Nipple): Filling, red/small blisters or bruises, mild/mod discomfort  Hold (Positioning): Assistance needed to correctly position infant at breast and maintain latch.  LATCH Score: 7   Lactation Tools Discussed/Used Tools: Pump;Shells Breast pump type: Double-Electric Breast Pump  Interventions Interventions: Breast feeding basics  reviewed;Support pillows;Assisted with latch;Position options;Skin to skin;Expressed milk;Breast massage;Pre-pump if needed;Shells;Hand express;Breast compression;Adjust position;DEBP  Discharge    Consult Status Consult Status: Follow-up Date: 02/08/21 Follow-up type: In-patient    Jossalyn Forgione, Diamond Nickel 02/08/2021, 1:15 AM

## 2021-02-09 ENCOUNTER — Telehealth: Payer: Self-pay | Admitting: *Deleted

## 2021-02-09 NOTE — Telephone Encounter (Signed)
Transition Care Management Follow-up Telephone Call  Date of discharge and from where: 02/08/2021 - [facility]  How have you been since you were released from the hospital? "I am doing okay"  Any questions or concerns? No  Items Reviewed:  Did the pt receive and understand the discharge instructions provided? Yes   Medications obtained and verified? Yes   Other? No   Any new allergies since your discharge? No   Dietary orders reviewed? No  Do you have support at home? Yes    Functional Questionnaire: (I = Independent and D = Dependent) ADLs: I  Bathing/Dressing- I  Meal Prep- I  Eating- I  Maintaining continence- I  Transferring/Ambulation- I  Managing Meds- I  Follow up appointments reviewed:   PCP Hospital f/u appt confirmed? No    Specialist Hospital f/u appt confirmed? No    Are transportation arrangements needed? No   If their condition worsens, is the pt aware to call PCP or go to the Emergency Dept.? Yes  Was the patient provided with contact information for the PCP's office or ED? Yes  Was to pt encouraged to call back with questions or concerns? Yes

## 2021-02-11 ENCOUNTER — Telehealth (HOSPITAL_COMMUNITY): Payer: Self-pay

## 2021-03-04 DIAGNOSIS — Z419 Encounter for procedure for purposes other than remedying health state, unspecified: Secondary | ICD-10-CM | POA: Diagnosis not present

## 2021-03-08 ENCOUNTER — Other Ambulatory Visit: Payer: Self-pay

## 2021-03-08 ENCOUNTER — Ambulatory Visit
Admission: EM | Admit: 2021-03-08 | Discharge: 2021-03-08 | Disposition: A | Discharge status: Home / Self Care | Payer: Medicaid Other | Attending: Family Medicine | Admitting: Family Medicine

## 2021-03-08 ENCOUNTER — Encounter: Payer: Self-pay | Admitting: *Deleted

## 2021-03-08 DIAGNOSIS — L02411 Cutaneous abscess of right axilla: Secondary | ICD-10-CM

## 2021-03-08 DIAGNOSIS — L02412 Cutaneous abscess of left axilla: Secondary | ICD-10-CM | POA: Diagnosis not present

## 2021-03-08 DIAGNOSIS — L0291 Cutaneous abscess, unspecified: Secondary | ICD-10-CM

## 2021-03-08 DIAGNOSIS — L309 Dermatitis, unspecified: Secondary | ICD-10-CM | POA: Diagnosis not present

## 2021-03-08 MED ORDER — DOXYCYCLINE HYCLATE 100 MG PO CAPS
100.0000 mg | ORAL_CAPSULE | Freq: Two times a day (BID) | ORAL | 0 refills | Status: DC
Start: 2021-03-08 — End: 2021-08-02

## 2021-03-08 MED ORDER — TRIAMCINOLONE ACETONIDE 0.1 % EX CREA: 1.0000 "application " | TOPICAL_CREAM | Freq: Two times a day (BID) | CUTANEOUS | 0 refills | Status: DC

## 2021-03-08 NOTE — ED Provider Notes (Signed)
[name]    CSN: [id number] Arrival date & time: 03/08/21  1654      History   Chief Complaint Chief Complaint  Patient presents with  . Abscess    HPI Theresa Shepherd is a 35 y.o. female.   HPI  Patient presents today with abscess involving right and left axilla. Patient is postpartum x 4 weeks ago. Has noticed abscess of over one week and pain has increased over that period of time. No fever. The area has drained pus.  Past Medical History:  Diagnosis Date  . Asthma    as child  . Eczema   . Gestational diabetes   . Infection    UTI  . Kidney stones   . UTI (urinary tract infection)     Patient Active Problem List   Diagnosis Date Noted  . Encounter for maternal care for low transverse scar from repeat cesarean delivery 02/06/2021  . S/P repeat low transverse C-section 02/06/2021  . History of cesarean delivery x 1--2012, FTP 02/04/2021  . History of COVID-19--12/2020 02/04/2021  . History of urinary calculi--2021 02/04/2021  . Gestational diabetes mellitus (GDM), antepartum 12/11/2020  . GBS bacteriuria 09/19/2020    Past Surgical History:  Procedure Laterality Date  . CESAREAN SECTION    . CESAREAN SECTION N/A 02/06/2021   Procedure: CESAREAN SECTION;  Surgeon: Jaymes Graff, MD;  Location: [facility] LD ORS;  Service: Obstetrics;  Laterality: N/A;  . INDUCED ABORTION      OB History    Gravida  3   Para  2   Term  2   Preterm      AB  1   Living  2     SAB      IAB  1   Ectopic      Multiple  0   Live Births  2            Home Medications    Prior to Admission medications   Medication Sig Start Date End Date Taking? Authorizing Provider  doxycycline (VIBRAMYCIN) 100 MG capsule Take 1 capsule (100 mg total) by mouth 2 (two) times daily. 03/08/21  Yes Bing Neighbors, FNP  triamcinolone cream (KENALOG) 0.1 % Apply 1 application topically 2 (two) times daily. 03/08/21  Yes Bing Neighbors, FNP  acetaminophen (TYLENOL)  325 MG tablet Take 2 tablets (650 mg total) by mouth every 6 (six) hours as needed for moderate pain. 02/08/21   Holshouser, Gerhard Munch, CNM  ibuprofen (ADVIL) 600 MG tablet Take 1 tablet (600 mg total) by mouth every 6 (six) hours. 02/08/21   Holshouser, Gerhard Munch, CNM  iron polysaccharides (NIFEREX) 150 MG capsule Take 1 capsule (150 mg total) by mouth daily. 02/08/21   Holshouser, Gerhard Munch, CNM  oxyCODONE (OXY IR/ROXICODONE) 5 MG immediate release tablet Take 1-2 tablets (5-10 mg total) by mouth every 6 (six) hours as needed for breakthrough pain. 02/08/21   Holshouser, Gerhard Munch, CNM  Prenatal Vit-Fe Fumarate-FA (PRENATAL VITAMINS PO) Take 1 tablet by mouth every evening.    [provider]    Family History Family History  Problem Relation Age of Onset  . Diabetes Maternal Grandmother   . Heart disease Maternal Grandmother   . Asthma Other   . Hypertension Other   . Diabetes Other   . Heart disease Paternal Grandfather   . Diabetes Mother   . Hypertension Mother   . COPD Mother   . Hypertension Father   . Stomach  cancer Paternal Grandmother     Social History Social History   Tobacco Use  . Smoking status: Never Smoker  . Smokeless tobacco: Never Used  Vaping Use  . Vaping Use: Never used  Substance Use Topics  . Alcohol use: Not Currently    Alcohol/week: 3.0 standard drinks    Types: 3 Standard drinks or equivalent per week    Comment: 2-3x/month  . Drug use: Not Currently    Types: Marijuana    Comment: last use 10/17/19     Allergies   Augmentin [amoxicillin-pot clavulanate]   Review of Systems Review of Systems Pertinent negatives listed in HPI   Physical Exam Triage Vital Signs ED Triage Vitals [03/08/21 1814]  Enc Vitals Group     BP 122/77     Pulse Rate 66     Resp 16     Temp 98.2 F (36.8 C)     Temp Source Oral     SpO2 97 %     Weight      Height      Head Circumference      Peak Flow      Pain Score 5     Pain Loc      Pain  Edu?      Excl. in GC?    No data found.  Updated Vital Signs BP 122/77 (BP Location: Left Arm)   Pulse 66   Temp 98.2 F (36.8 C) (Oral)   Resp 16   LMP 05/07/2020   SpO2 97%   Visual Acuity Right Eye Distance:   Left Eye Distance:   Bilateral Distance:    Right Eye Near:   Left Eye Near:    Bilateral Near:     Physical Exam  General appearance: alert, well developed, well nourished, cooperative and in no distress Head: Normocephalic, without obvious abnormality, atraumatic Respiratory: Respirations even and unlabored, normal respiratory rate Heart: rate and rhythm normal. 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 Macular rashes (diffuse) Skin: Skin color, texture, turgor normal. No rashes seen  Psych: Anxious mood and affect. Neurologic:  GCS 15, normal coordination, normal gait  UC Treatments / Results  Labs (all labs ordered are listed, but only abnormal results are displayed) Labs Reviewed - No data to display  EKG   Radiology No results found.  Procedures Incision and Drainage  Date/Time: 03/08/2021 7:21 PM Performed by: Bing Neighbors, FNP Authorized by: Bing Neighbors, FNP   Consent:    Consent obtained:  Verbal   Risks discussed:  Bleeding, incomplete drainage and infection Universal protocol:    Patient identity confirmed:  Verbally with patient Location:    Type:  Abscess   Size:  14mm   Location:  Upper extremity   Upper extremity location:  Arm   Arm location:  R upper arm (axilla ) Pre-procedure details:    Skin preparation:  Povidone-iodine Anesthesia:    Anesthesia method:  Topical application and local infiltration   Topical anesthetic:  LET   Local anesthetic:  Lidocaine 2% WITH epi Procedure type:    Complexity:  Complex Procedure details:    Incision types:  Stab incision   Incision depth:  Submucosal   Drainage:  Purulent   Drainage amount:  Copious    Packing materials:  None Post-procedure details:    Procedure completion:  Tolerated   (including critical care time)  Medications Ordered in UC Medications - No data  to display  Initial Impression / Assessment and Plan / UC Course  I have reviewed the triage vital signs and the nursing notes.  Pertinent labs & imaging results that were available during my care of the patient were reviewed by me and considered in my medical decision making (see chart for details).     Chronic eczema, refilled triamcinolone. Abscess of right and left axilla, however, only right axilla warrants I&D. Patient tolerated. Doxy cline BID x 10 days for infected skin cyst. Wound care instructions provided. Return precautions given.  Final Clinical Impressions(s) / UC Diagnoses   Final diagnoses:  Chronic eczema  Abscess of left axilla  Abscess of right axilla   Discharge Instructions   None    ED Prescriptions    Medication Sig Dispense Auth. Provider   triamcinolone cream (KENALOG) 0.1 % Apply 1 application topically 2 (two) times daily. 454 g Bing Neighbors, FNP   doxycycline (VIBRAMYCIN) 100 MG capsule Take 1 capsule (100 mg total) by mouth 2 (two) times daily. 20 capsule Bing Neighbors, FNP     PDMP not reviewed this encounter.   Bing Neighbors, FNP 03/11/21 2018

## 2021-03-08 NOTE — ED Triage Notes (Signed)
Pt has abscess in RT and Lt axilla.

## 2021-04-04 DIAGNOSIS — Z419 Encounter for procedure for purposes other than remedying health state, unspecified: Secondary | ICD-10-CM | POA: Diagnosis not present

## 2021-04-12 ENCOUNTER — Ambulatory Visit
Admission: EM | Admit: 2021-04-12 | Discharge: 2021-04-12 | Disposition: A | Discharge status: Home / Self Care | Payer: Medicaid Other | Attending: Family Medicine | Admitting: Family Medicine

## 2021-04-12 DIAGNOSIS — L309 Dermatitis, unspecified: Secondary | ICD-10-CM

## 2021-04-12 DIAGNOSIS — N898 Other specified noninflammatory disorders of vagina: Secondary | ICD-10-CM

## 2021-04-12 DIAGNOSIS — Z113 Encounter for screening for infections with a predominantly sexual mode of transmission: Secondary | ICD-10-CM

## 2021-04-12 DIAGNOSIS — N390 Urinary tract infection, site not specified: Secondary | ICD-10-CM

## 2021-04-12 LAB — POCT URINALYSIS DIP (MANUAL ENTRY)
Bilirubin, UA: NEGATIVE
Glucose, UA: NEGATIVE mg/dL
Nitrite, UA: NEGATIVE
Protein Ur, POC: 100 mg/dL — AB
Spec Grav, UA: 1.02 (ref 1.010–1.025)
Urobilinogen, UA: 0.2 E.U./dL
pH, UA: 5.5 (ref 5.0–8.0)

## 2021-04-12 MED ORDER — CLOBETASOL PROPIONATE 0.05 % EX OINT: 1.0000 "application " | TOPICAL_OINTMENT | Freq: Two times a day (BID) | CUTANEOUS | 0 refills | Status: DC

## 2021-04-12 MED ORDER — NITROFURANTOIN MONOHYD MACRO 100 MG PO CAPS: 100.0000 mg | ORAL_CAPSULE | Freq: Two times a day (BID) | ORAL | 0 refills | Status: DC

## 2021-04-12 MED ORDER — PHENAZOPYRIDINE HCL 200 MG PO TABS: 200.0000 mg | ORAL_TABLET | Freq: Three times a day (TID) | ORAL | 0 refills | Status: DC

## 2021-04-12 MED ORDER — TRIAMCINOLONE ACETONIDE 0.1 % EX CREA: 1.0000 "application " | TOPICAL_CREAM | Freq: Two times a day (BID) | CUTANEOUS | 0 refills | Status: DC

## 2021-04-12 NOTE — ED Triage Notes (Signed)
Six day h/o odor with urination, dysuria and vaginal discharge. Confirms decreased appetite Has been taking AZO with some relief. Denies hematuria and vaginal itching. No n/v/d. Pt reports sxs feel similar to when she had a kidney stone. She would like to get STD testing.

## 2021-04-13 ENCOUNTER — Telehealth (HOSPITAL_COMMUNITY): Payer: Self-pay | Admitting: Emergency Medicine

## 2021-04-13 LAB — CERVICOVAGINAL ANCILLARY ONLY
Bacterial Vaginitis (gardnerella): POSITIVE — AB
Candida Glabrata: NEGATIVE
Candida Vaginitis: NEGATIVE
Chlamydia: NEGATIVE
Comment: NEGATIVE
Comment: NEGATIVE
Comment: NEGATIVE
Comment: NEGATIVE
Comment: NEGATIVE
Comment: NORMAL
Neisseria Gonorrhea: NEGATIVE
Trichomonas: NEGATIVE

## 2021-04-13 MED ORDER — METRONIDAZOLE 500 MG PO TABS: 500.0000 mg | ORAL_TABLET | Freq: Two times a day (BID) | ORAL | 0 refills | Status: DC

## 2021-04-15 LAB — URINE CULTURE: Culture: >=100000 — AB

## 2021-04-16 NOTE — ED Provider Notes (Signed)
[name]    CSN: [id number] Arrival date & time: 04/12/21  1832      History   Chief Complaint Chief Complaint  Patient presents with   Dysuria   SEXUALLY TRANSMITTED DISEASE    HPI Theresa Shepherd is a 35 y.o. female.   Presenting today with almost a week of dysuria, urine odor, vaginal discharge, malaise. Denies hematuria, vaginal itching or irritation, pelvic or abdominal pain, flank pain, fever, chills, N/V/D. LMP 03/26/21. No known exposures to STIs but does request testing. Hx of kidney stones but does not feel exactly similar. So far trying AZO with mild relief.    Past Medical History:  Diagnosis Date   Asthma    as child   Eczema    Gestational diabetes    Infection    UTI   Kidney stones    UTI (urinary tract infection)     Patient Active Problem List   Diagnosis Date Noted   Encounter for maternal care for low transverse scar from repeat cesarean delivery 02/06/2021   S/P repeat low transverse C-section 02/06/2021   History of cesarean delivery x 1--2012, FTP 02/04/2021   History of COVID-19--12/2020 02/04/2021   History of urinary calculi--2021 02/04/2021   Gestational diabetes mellitus (GDM), antepartum 12/11/2020   GBS bacteriuria 09/19/2020    Past Surgical History:  Procedure Laterality Date   CESAREAN SECTION     CESAREAN SECTION N/A 02/06/2021   Procedure: CESAREAN SECTION;  Surgeon: Jaymes Graff, MD;  Location: [facility];  Service: Obstetrics;  Laterality: N/A;   INDUCED ABORTION      OB History     Gravida  3   Para  2   Term  2   Preterm      AB  1   Living  2      SAB      IAB  1   Ectopic      Multiple  0   Live Births  2            Home Medications    Prior to Admission medications   Medication Sig Start Date End Date Taking? Authorizing Provider  clobetasol ointment (TEMOVATE) 0.05 % Apply 1 application topically 2 (two) times daily. 04/12/21  Yes Particia Nearing, PA-C  nitrofurantoin,  macrocrystal-monohydrate, (MACROBID) 100 MG capsule Take 1 capsule (100 mg total) by mouth 2 (two) times daily. 04/12/21  Yes Particia Nearing, PA-C  phenazopyridine (PYRIDIUM) 200 MG tablet Take 1 tablet (200 mg total) by mouth 3 (three) times daily. 04/12/21  Yes Particia Nearing, PA-C  triamcinolone cream (KENALOG) 0.1 % Apply 1 application topically 2 (two) times daily. 04/12/21  Yes Particia Nearing, PA-C  acetaminophen (TYLENOL) 325 MG tablet Take 2 tablets (650 mg total) by mouth every 6 (six) hours as needed for moderate pain. 02/08/21   Holshouser, Gerhard Munch, CNM  doxycycline (VIBRAMYCIN) 100 MG capsule Take 1 capsule (100 mg total) by mouth 2 (two) times daily. 03/08/21   Bing Neighbors, FNP  ibuprofen (ADVIL) 600 MG tablet Take 1 tablet (600 mg total) by mouth every 6 (six) hours. 02/08/21   Holshouser, Gerhard Munch, CNM  iron polysaccharides (NIFEREX) 150 MG capsule Take 1 capsule (150 mg total) by mouth daily. 02/08/21   Holshouser, Gerhard Munch, CNM  metroNIDAZOLE (FLAGYL) 500 MG tablet Take 1 tablet (500 mg total) by mouth 2 (two) times daily. 04/13/21   Lamptey, Britta Mccreedy, MD  oxyCODONE (OXY IR/ROXICODONE)  5 MG immediate release tablet Take 1-2 tablets (5-10 mg total) by mouth every 6 (six) hours as needed for breakthrough pain. 02/08/21   Holshouser, Gerhard Munch, CNM  Prenatal Vit-Fe Fumarate-FA (PRENATAL VITAMINS PO) Take 1 tablet by mouth every evening.    [provider]  triamcinolone cream (KENALOG) 0.1 % Apply 1 application topically 2 (two) times daily. 03/08/21   Bing Neighbors, FNP    Family History Family History  Problem Relation Age of Onset   Diabetes Maternal Grandmother    Heart disease Maternal Grandmother    Asthma Other    Hypertension Other    Diabetes Other    Heart disease Paternal Grandfather    Diabetes Mother    Hypertension Mother    COPD Mother    Hypertension Father    Stomach cancer Paternal Grandmother     Social History Social  History   Tobacco Use   Smoking status: Never   Smokeless tobacco: Never  Vaping Use   Vaping Use: Never used  Substance Use Topics   Alcohol use: Not Currently    Alcohol/week: 3.0 standard drinks    Types: 3 Standard drinks or equivalent per week    Comment: 2-3x/month   Drug use: Not Currently    Types: Marijuana    Comment: last use 10/17/19     Allergies   Augmentin [amoxicillin-pot clavulanate]   Review of Systems Review of Systems Per hpi    Physical Exam Triage Vital Signs ED Triage Vitals  Enc Vitals Group     BP 04/12/21 1936 (!) 147/84     Pulse Rate 04/12/21 1936 71     Resp 04/12/21 1936 18     Temp 04/12/21 1936 98.5 F (36.9 C)     Temp Source 04/12/21 1936 Oral     SpO2 04/12/21 1936 97 %     Weight --      Height --      Head Circumference --      Peak Flow --      Pain Score 04/12/21 1940 8     Pain Loc --      Pain Edu? --      Excl. in GC? --    No data found.  Updated Vital Signs BP (!) 147/84 (BP Location: Left Arm)   Pulse 71   Temp 98.5 F (36.9 C) (Oral)   Resp 18   LMP 03/26/2021 (Approximate)   SpO2 97%   Breastfeeding No   Visual Acuity Right Eye Distance:   Left Eye Distance:   Bilateral Distance:    Right Eye Near:   Left Eye Near:    Bilateral Near:     Physical Exam Vitals and nursing note reviewed.  Constitutional:      Appearance: Normal appearance. She is not ill-appearing.  HENT:     Head: Atraumatic.  Eyes:     Extraocular Movements: Extraocular movements intact.     Conjunctiva/sclera: Conjunctivae normal.  Cardiovascular:     Rate and Rhythm: Normal rate and regular rhythm.     Heart sounds: Normal heart sounds.  Pulmonary:     Effort: Pulmonary effort is normal.     Breath sounds: Normal breath sounds.  Abdominal:     General: Bowel sounds are normal. There is no distension.     Palpations: Abdomen is soft.     Tenderness: There is no abdominal tenderness. There is guarding (minimal  suprapubic ttp). There is no right CVA tenderness or left  CVA tenderness.  Genitourinary:    Comments: GU exam deferred, self swab performed Musculoskeletal:        General: Normal range of motion.     Cervical back: Normal range of motion and neck supple.  Skin:    General: Skin is warm and dry.     Comments: Dry, lichenified rash across left lateral flank  Neurological:     Mental Status: She is alert and oriented to person, place, and time.  Psychiatric:        Mood and Affect: Mood normal.        Thought Content: Thought content normal.        Judgment: Judgment normal.     UC Treatments / Results  Labs (all labs ordered are listed, but only abnormal results are displayed) Labs Reviewed  URINE CULTURE - Abnormal; Notable for the following components:      Result Value   Culture >=100,000 COLONIES/mL ESCHERICHIA COLI (*)    Organism ID, Bacteria ESCHERICHIA COLI (*)    All other components within normal limits  POCT URINALYSIS DIP (MANUAL ENTRY) - Abnormal; Notable for the following components:   Clarity, UA cloudy (*)    Ketones, POC UA trace (5) (*)    Blood, UA moderate (*)    Protein Ur, POC =100 (*)    Leukocytes, UA Large (3+) (*)    All other components within normal limits  CERVICOVAGINAL ANCILLARY ONLY - Abnormal; Notable for the following components:   Bacterial Vaginitis (gardnerella) Positive (*)    All other components within normal limits    EKG   Radiology No results found.  Procedures Procedures (including critical care time)  Medications Ordered in UC Medications - No data to display  Initial Impression / Assessment and Plan / UC Course  I have reviewed the triage vital signs and the nursing notes.  Pertinent labs & imaging results that were available during my care of the patient were reviewed by me and considered in my medical decision making (see chart for details).     Vitals and exam reassuring today, U/A showing likely UTI. Will send  for culture and await vaginal swab results. Start macrobid, pyridium while awaiting results. She also notes an eczema rash during exam and states her triamcinolone cream isn't seeming to help. Will also send clobetasol for short term spot treatment and discussed moisturizers and other home supportive care. F/u with PCP for further eval and recheck.    Final Clinical Impressions(s) / UC Diagnoses   Final diagnoses:  Acute lower UTI  Vaginal discharge  Routine screening for STI (sexually transmitted infection)  Eczema, unspecified type   Discharge Instructions   None    ED Prescriptions     Medication Sig Dispense Auth. Provider   nitrofurantoin, macrocrystal-monohydrate, (MACROBID) 100 MG capsule Take 1 capsule (100 mg total) by mouth 2 (two) times daily. 10 capsule Particia Nearing, [place]   phenazopyridine (PYRIDIUM) 200 MG tablet Take 1 tablet (200 mg total) by mouth 3 (three) times daily. 9 tablet Particia Nearing, [place]   triamcinolone cream (KENALOG) 0.1 % Apply 1 application topically 2 (two) times daily. 60 g Particia Nearing, [place]   clobetasol ointment (TEMOVATE) 0.05 % Apply 1 application topically 2 (two) times daily. 60 g Particia Nearing, [place]      PDMP not reviewed this encounter.   Particia Nearing, [place] 04/16/21 [phone]

## 2021-05-04 DIAGNOSIS — Z419 Encounter for procedure for purposes other than remedying health state, unspecified: Secondary | ICD-10-CM | POA: Diagnosis not present

## 2021-06-04 DIAGNOSIS — Z419 Encounter for procedure for purposes other than remedying health state, unspecified: Secondary | ICD-10-CM | POA: Diagnosis not present

## 2021-06-11 ENCOUNTER — Ambulatory Visit: Payer: Medicaid Other | Admitting: Urology

## 2021-06-26 ENCOUNTER — Ambulatory Visit (HOSPITAL_COMMUNITY): Admission: RE | Admit: 2021-06-26 | Payer: Medicaid Other | Source: Ambulatory Visit

## 2021-06-29 ENCOUNTER — Other Ambulatory Visit: Payer: Self-pay

## 2021-06-29 ENCOUNTER — Ambulatory Visit (HOSPITAL_COMMUNITY)
Admission: RE | Admit: 2021-06-29 | Discharge: 2021-06-29 | Disposition: A | Discharge status: Home / Self Care | Payer: Medicaid Other | Source: Ambulatory Visit | Attending: Urology | Admitting: Urology

## 2021-06-29 DIAGNOSIS — N200 Calculus of kidney: Secondary | ICD-10-CM | POA: Insufficient documentation

## 2021-06-29 DIAGNOSIS — N1330 Unspecified hydronephrosis: Secondary | ICD-10-CM | POA: Diagnosis not present

## 2021-07-03 ENCOUNTER — Telehealth (INDEPENDENT_AMBULATORY_CARE_PROVIDER_SITE_OTHER): Payer: Medicaid Other | Admitting: Urology

## 2021-07-03 ENCOUNTER — Other Ambulatory Visit: Payer: Self-pay

## 2021-07-03 ENCOUNTER — Encounter: Payer: Self-pay | Admitting: Urology

## 2021-07-03 DIAGNOSIS — N200 Calculus of kidney: Secondary | ICD-10-CM

## 2021-07-03 NOTE — H&P (View-Only) (Signed)
07/03/2021 3:17 PM   Theresa Shepherd December 20, 1985 [id number]  Referring provider: No referring provider defined for this encounter.  Patient location: home Physician location: office I connected with  Theresa Shepherd on 07/03/21 by a video enabled telemedicine application and verified that I am speaking with the correct person using two identifiers.   I discussed the limitations of evaluation and management by telemedicine. The patient expressed understanding and agreed to proceed.    Followup nephrolithiasis  HPI: Ms Theresa Shepherd is 35yo here for followup for nephrolithiasis. She has not had any stone events since last visit. She was diagnosed with UTI in June 2022. Renal US shows left renal calculi that are stable in size. She has intermittent left flank pain. NO worsening LUTS.    PMH: Past Medical History:  Diagnosis Date   Asthma    as child   Eczema    Gestational diabetes    Infection    UTI   Kidney stones    UTI (urinary tract infection)     Surgical History: Past Surgical History:  Procedure Laterality Date   CESAREAN SECTION     CESAREAN SECTION N/A 02/06/2021   Procedure: CESAREAN SECTION;  Surgeon: Jaymes Graff, MD;  Location: [facility];  Service: Obstetrics;  Laterality: N/A;   INDUCED ABORTION      Home Medications:  Allergies as of 07/03/2021       Reactions   Augmentin [amoxicillin-pot Clavulanate] Hives        Medication List        Accurate as of July 03, 2021  3:17 PM. If you have any questions, ask your nurse or doctor.          acetaminophen 325 MG tablet Commonly known as: TYLENOL Take 2 tablets (650 mg total) by mouth every 6 (six) hours as needed for moderate pain.   clobetasol ointment 0.05 % Commonly known as: TEMOVATE Apply 1 application topically 2 (two) times daily.   doxycycline 100 MG capsule Commonly known as: VIBRAMYCIN Take 1 capsule (100 mg total) by mouth 2 (two) times daily.   ibuprofen 600 MG tablet Commonly  known as: ADVIL Take 1 tablet (600 mg total) by mouth every 6 (six) hours.   iron polysaccharides 150 MG capsule Commonly known as: NIFEREX Take 1 capsule (150 mg total) by mouth daily.   metroNIDAZOLE 500 MG tablet Commonly known as: FLAGYL Take 1 tablet (500 mg total) by mouth 2 (two) times daily.   nitrofurantoin (macrocrystal-monohydrate) 100 MG capsule Commonly known as: MACROBID Take 1 capsule (100 mg total) by mouth 2 (two) times daily.   oxyCODONE 5 MG immediate release tablet Commonly known as: Oxy IR/ROXICODONE Take 1-2 tablets (5-10 mg total) by mouth every 6 (six) hours as needed for breakthrough pain.   phenazopyridine 200 MG tablet Commonly known as: PYRIDIUM Take 1 tablet (200 mg total) by mouth 3 (three) times daily.   PRENATAL VITAMINS PO Take 1 tablet by mouth every evening.   triamcinolone cream 0.1 % Commonly known as: KENALOG Apply 1 application topically 2 (two) times daily.   triamcinolone cream 0.1 % Commonly known as: KENALOG Apply 1 application topically 2 (two) times daily.        Allergies:  Allergies  Allergen Reactions   Augmentin [Amoxicillin-Pot Clavulanate] Hives    Family History: Family History  Problem Relation Age of Onset   Diabetes Maternal Grandmother    Heart disease Maternal Grandmother    Asthma Other    Hypertension  Other    Diabetes Other    Heart disease Paternal Grandfather    Diabetes Mother    Hypertension Mother    COPD Mother    Hypertension Father    Stomach cancer Paternal Grandmother     Social History:  reports that she has never smoked. She has never used smokeless tobacco. She reports that she does not currently use alcohol after a past usage of about 3.0 standard drinks per week. She reports that she does not currently use drugs after having used the following drugs: Marijuana.  ROS: All other review of systems were reviewed and are negative except what is noted above in HPI   Laboratory  Data: Lab Results  Component Value Date   WBC 12.8 (H) 02/07/2021   HGB 8.6 (L) 02/07/2021   HCT 24.7 (L) 02/07/2021   MCV 88.5 02/07/2021   PLT 188 02/07/2021    Lab Results  Component Value Date   CREATININE 0.56 02/05/2021    No results found for: PSA  No results found for: TESTOSTERONE  No results found for: HGBA1C  Urinalysis    Component Value Date/Time   COLORURINE STRAW (A) 01/29/2021 2033   APPEARANCEUR CLEAR 01/29/2021 2033   APPEARANCEUR Clear 10/16/2020 1346   LABSPEC 1.004 (L) 01/29/2021 2033   PHURINE 6.0 01/29/2021 2033   GLUCOSEU NEGATIVE 01/29/2021 2033   HGBUR NEGATIVE 01/29/2021 2033   BILIRUBINUR negative 04/12/2021 1954   BILIRUBINUR negative 11/08/2020 1024   BILIRUBINUR Negative 10/16/2020 1346   KETONESUR trace (5) (A) 04/12/2021 1954   KETONESUR NEGATIVE 01/29/2021 2033   PROTEINUR =100 (A) 04/12/2021 1954   PROTEINUR NEGATIVE 01/29/2021 2033   UROBILINOGEN 0.2 04/12/2021 1954   UROBILINOGEN 0.2 09/28/2019 1151   NITRITE Negative 04/12/2021 1954   NITRITE NEGATIVE 01/29/2021 2033   LEUKOCYTESUR Large (3+) (A) 04/12/2021 1954   LEUKOCYTESUR NEGATIVE 01/29/2021 2033    Lab Results  Component Value Date   LABMICR See below: 10/16/2020   WBCUA 0-5 10/16/2020   LABEPIT 0-10 10/16/2020   BACTERIA RARE (A) 10/17/2020    Pertinent Imaging: Renal US 06/30/2021 Images reviewed and discussed with the patient No results found for this or any previous visit.  No results found for this or any previous visit.  No results found for this or any previous visit.  No results found for this or any previous visit.  Results for orders placed during the hospital encounter of 06/29/21  Ultrasound renal complete  Narrative CLINICAL DATA:  History of nephrolithiasis.  EXAM: RENAL / URINARY TRACT ULTRASOUND COMPLETE  COMPARISON:  None.  FINDINGS: Right Kidney:  Renal measurements: 10.4 x 5.9 x 6.5 cm = volume: 209 mL. Echogenicity within  normal limits. No mass or hydronephrosis visualized.  Left Kidney:  Renal measurements: 10.1 x 5.7 x 6.4 cm = volume: 191 mL. Multiple shadowing stones identified with the largest measuring 9.9 mm. Mild hydronephrosis.  Bladder:  Appears normal for degree of bladder distention.  Other:  None.  IMPRESSION: 1. Multiple stones seen in the left kidney with the largest measuring 9.9 mm. 2. Mild hydronephrosis on the left new compared to November 01, 2020 but otherwise age indeterminate. Recommend clinical correlation. If there is concern for acute obstruction, a CT scan could better evaluate. 3. No abnormalities in the right kidney.   Electronically Signed By: Gerome Sam III M.D. On: 06/30/2021 13:07  No results found for this or any previous visit.  No results found for this or any previous visit.  No results found for this or any previous visit.   Assessment & Plan:    1. Nephrolithiasis -We discussed the management of kidney stones. These options include observation, ureteroscopy, shockwave lithotripsy (ESWL) and percutaneous nephrolithotomy (PCNL). We discussed which options are relevant to the patient's stone(s). We discussed the natural history of kidney stones as well as the complications of untreated stones and the impact on quality of life without treatment as well as with each of the above listed treatments. We also discussed the efficacy of each treatment in its ability to clear the stone burden. With any of these management options I discussed the signs and symptoms of infection and the need for emergent treatment should these be experienced. For each option we discussed the ability of each procedure to clear the patient of their stone burden.   For observation I described the risks which include but are not limited to silent renal damage, life-threatening infection, need for emergent surgery, failure to pass stone and pain.   For ureteroscopy I described the  risks which include bleeding, infection, damage to contiguous structures, positioning injury, ureteral stricture, ureteral avulsion, ureteral injury, need for prolonged ureteral stent, inability to perform ureteroscopy, need for an interval procedure, inability to clear stone burden, stent discomfort/pain, heart attack, stroke, pulmonary embolus and the inherent risks with general anesthesia.   For shockwave lithotripsy I described the risks which include arrhythmia, kidney contusion, kidney hemorrhage, need for transfusion, pain, inability to adequately break up stone, inability to pass stone fragments, Steinstrasse, infection associated with obstructing stones, need for alternate surgical procedure, need for repeat shockwave lithotripsy, MI, CVA, PE and the inherent risks with anesthesia/conscious sedation.   For PCNL I described the risks including positioning injury, pneumothorax, hydrothorax, need for chest tube, inability to clear stone burden, renal laceration, arterial venous fistula or malformation, need for embolization of kidney, loss of kidney or renal function, need for repeat procedure, need for prolonged nephrostomy tube, ureteral avulsion, MI, CVA, PE and the inherent risks of general anesthesia.   - The patient would like to proceed with left ureteroscopic stone extraction   No follow-ups on file.  Wilkie Aye, MD  [facility]

## 2021-07-03 NOTE — Patient Instructions (Signed)
Textbook of Natural Medicine (5th ed., pp. 1518-1527.e3). [place], [place]: Elsevier.">  Dietary Guidelines to Help Prevent Kidney Stones Kidney stones are deposits of minerals and salts that form inside your kidneys. Your risk of developing kidney stones may be greater depending on your diet, your lifestyle, the medicines you take, and whether you have certain medical conditions. Most people can lower their chances of developing kidney stones by following the instructions below. Your dietitian may give you more specific instructions depending on your overall health and the type of kidney stones youtend to develop. What are tips for following this plan? Reading food labels  Choose foods with "no salt added" or "low-salt" labels. Limit your salt (sodium) intake to less than 1,500 mg a day. Choose foods with calcium for each meal and snack. Try to eat about 300 mg of calcium at each meal. Foods that contain 200-500 mg of calcium a serving include: 8 oz (237 mL) of milk, calcium-fortifiednon-dairy milk, and calcium-fortifiedfruit juice. Calcium-fortified means that calcium has been added to these drinks. 8 oz (237 mL) of kefir, yogurt, and soy yogurt. 4 oz (114 g) of tofu. 1 oz (28 g) of cheese. 1 cup (150 g) of dried figs. 1 cup (91 g) of cooked broccoli. One 3 oz (85 g) can of sardines or mackerel. Most people need 1,000-1,500 mg of calcium a day. Talk to your dietitian abouthow much calcium is recommended for you. Shopping Buy plenty of fresh fruits and vegetables. Most people do not need to avoid fruits and vegetables, even if these foods contain nutrients that may contribute to kidney stones. When shopping for convenience foods, choose: Whole pieces of fruit. Pre-made salads with dressing on the side. Low-fat fruit and yogurt smoothies. Avoid buying frozen meals or prepared deli foods. These can be high in sodium. Look for foods with live cultures, such as yogurt and kefir. Choose high-fiber  grains, such as whole-wheat breads, oat bran, and wheat cereals. Cooking Do not add salt to food when cooking. Place a salt shaker on the table and allow each person to add his or her own salt to taste. Use vegetable protein, such as beans, textured vegetable protein (TVP), or tofu, instead of meat in pasta, casseroles, and soups. Meal planning Eat less salt, if told by your dietitian. To do this: Avoid eating processed or pre-made food. Avoid eating fast food. Eat less animal protein, including cheese, meat, poultry, or fish, if told by your dietitian. To do this: Limit the number of times you have meat, poultry, fish, or cheese each week. Eat a diet free of meat at least 2 days a week. Eat only one serving each day of meat, poultry, fish, or seafood. When you prepare animal protein, cut pieces into small portion sizes. For most meat and fish, one serving is about the size of the palm of your hand. Eat at least five servings of fresh fruits and vegetables each day. To do this: Keep fruits and vegetables on hand for snacks. Eat one piece of fruit or a handful of berries with breakfast. Have a salad and fruit at lunch. Have two kinds of vegetables at dinner. Limit foods that are high in a substance called oxalate. These include: Spinach (cooked), rhubarb, beets, sweet potatoes, and Swiss chard. Peanuts. Potato chips, french fries, and baked potatoes with skin on. Nuts and nut products. Chocolate. If you regularly take a diuretic medicine, make sure to eat at least 1 or 2 servings of fruits or vegetables that are   high in potassium each day. These include: Avocado. Banana. Orange, prune, carrot, or tomato juice. Baked potato. Cabbage. Beans and split peas. Lifestyle  Drink enough fluid to keep your urine pale yellow. This is the most important thing you can do. Spread your fluid intake throughout the day. If you drink alcohol: Limit how much you use to: 0-1 drink a day for women who  are not pregnant. 0-2 drinks a day for men. Be aware of how much alcohol is in your drink. In the [place] one drink equals one 12 oz bottle of beer (355 mL), one 5 oz glass of wine (148 mL), or one 1 oz glass of hard liquor (44 mL). Lose weight if told by your health care provider. Work with your dietitian to find an eating plan and weight loss strategies that work best for you.  General information Talk to your health care provider and dietitian about taking daily supplements. You may be told the following depending on your health and the cause of your kidney stones: Not to take supplements with vitamin C. To take a calcium supplement. To take a daily probiotic supplement. To take other supplements such as magnesium, fish oil, or vitamin B6. Take over-the-counter and prescription medicines only as told by your health care provider. These include supplements. What foods should I limit? Limit your intake of the following foods, or eat them as told by your dietitian. Vegetables Spinach. Rhubarb. Beets. Canned vegetables. Pickles. Olives. Baked potatoeswith skin. Grains Wheat bran. Baked goods. Salted crackers. Cereals high in sugar. Meats and other proteins Nuts. Nut butters. Large portions of meat, poultry, or fish. Salted, precooked,or cured meats, such as sausages, meat loaves, and hot dogs. Dairy Cheese. Beverages Regular soft drinks. Regular vegetable juice. Seasonings and condiments Seasoning blends with salt. Salad dressings. Soy sauce. Ketchup. Barbecue sauce. Other foods Canned soups. Canned pasta sauce. Casseroles. Pizza. Lasagna. Frozen meals.Potato chips. French fries. The items listed above may not be a complete list of foods and beverages you should limit. Contact a dietitian for more information. What foods should I avoid? Talk to your dietitian about specific foods you should avoid based on the typeof kidney stones you have and your overall health. Fruits Grapefruit. The  item listed above may not be a complete list of foods and beverages you should avoid. Contact a dietitian for more information. Summary Kidney stones are deposits of minerals and salts that form inside your kidneys. You can lower your risk of kidney stones by making changes to your diet. The most important thing you can do is drink enough fluid. Drink enough fluid to keep your urine pale yellow. Talk to your dietitian about how much calcium you should have each day, and eat less salt and animal protein as told by your dietitian. This information is not intended to replace advice given to you by your health care provider. Make sure you discuss any questions you have with your healthcare provider. Document Revised: 10/14/2019 Document Reviewed: 10/14/2019 Elsevier Patient Education  2022 [employer] Inc.  

## 2021-07-03 NOTE — Progress Notes (Signed)
07/03/2021 3:17 PM   Theresa Shepherd December 20, 1985 [id number]  Referring provider: No referring provider defined for this encounter.  Patient location: home Physician location: office I connected with  Theresa Shepherd on 07/03/21 by a video enabled telemedicine application and verified that I am speaking with the correct person using two identifiers.   I discussed the limitations of evaluation and management by telemedicine. The patient expressed understanding and agreed to proceed.    Followup nephrolithiasis  HPI: Ms Theresa Shepherd is 35yo here for followup for nephrolithiasis. She has not had any stone events since last visit. She was diagnosed with UTI in June 2022. Renal US shows left renal calculi that are stable in size. She has intermittent left flank pain. NO worsening LUTS.    PMH: Past Medical History:  Diagnosis Date   Asthma    as child   Eczema    Gestational diabetes    Infection    UTI   Kidney stones    UTI (urinary tract infection)     Surgical History: Past Surgical History:  Procedure Laterality Date   CESAREAN SECTION     CESAREAN SECTION N/A 02/06/2021   Procedure: CESAREAN SECTION;  Surgeon: Jaymes Graff, MD;  Location: [facility] ORS;  Service: Obstetrics;  Laterality: N/A;   INDUCED ABORTION      Home Medications:  Allergies as of 07/03/2021       Reactions   Augmentin [amoxicillin-pot Clavulanate] Hives        Medication List        Accurate as of July 03, 2021  3:17 PM. If you have any questions, ask your nurse or doctor.          acetaminophen 325 MG tablet Commonly known as: TYLENOL Take 2 tablets (650 mg total) by mouth every 6 (six) hours as needed for moderate pain.   clobetasol ointment 0.05 % Commonly known as: TEMOVATE Apply 1 application topically 2 (two) times daily.   doxycycline 100 MG capsule Commonly known as: VIBRAMYCIN Take 1 capsule (100 mg total) by mouth 2 (two) times daily.   ibuprofen 600 MG tablet Commonly  known as: ADVIL Take 1 tablet (600 mg total) by mouth every 6 (six) hours.   iron polysaccharides 150 MG capsule Commonly known as: NIFEREX Take 1 capsule (150 mg total) by mouth daily.   metroNIDAZOLE 500 MG tablet Commonly known as: FLAGYL Take 1 tablet (500 mg total) by mouth 2 (two) times daily.   nitrofurantoin (macrocrystal-monohydrate) 100 MG capsule Commonly known as: MACROBID Take 1 capsule (100 mg total) by mouth 2 (two) times daily.   oxyCODONE 5 MG immediate release tablet Commonly known as: Oxy IR/ROXICODONE Take 1-2 tablets (5-10 mg total) by mouth every 6 (six) hours as needed for breakthrough pain.   phenazopyridine 200 MG tablet Commonly known as: PYRIDIUM Take 1 tablet (200 mg total) by mouth 3 (three) times daily.   PRENATAL VITAMINS PO Take 1 tablet by mouth every evening.   triamcinolone cream 0.1 % Commonly known as: KENALOG Apply 1 application topically 2 (two) times daily.   triamcinolone cream 0.1 % Commonly known as: KENALOG Apply 1 application topically 2 (two) times daily.        Allergies:  Allergies  Allergen Reactions   Augmentin [Amoxicillin-Pot Clavulanate] Hives    Family History: Family History  Problem Relation Age of Onset   Diabetes Maternal Grandmother    Heart disease Maternal Grandmother    Asthma Other    Hypertension  Other    Diabetes Other    Heart disease Paternal Grandfather    Diabetes Mother    Hypertension Mother    COPD Mother    Hypertension Father    Stomach cancer Paternal Grandmother     Social History:  reports that she has never smoked. She has never used smokeless tobacco. She reports that she does not currently use alcohol after a past usage of about 3.0 standard drinks per week. She reports that she does not currently use drugs after having used the following drugs: Marijuana.  ROS: All other review of systems were reviewed and are negative except what is noted above in HPI   Laboratory  Data: Lab Results  Component Value Date   WBC 12.8 (H) 02/07/2021   HGB 8.6 (L) 02/07/2021   HCT 24.7 (L) 02/07/2021   MCV 88.5 02/07/2021   PLT 188 02/07/2021    Lab Results  Component Value Date   CREATININE 0.56 02/05/2021    No results found for: PSA  No results found for: TESTOSTERONE  No results found for: HGBA1C  Urinalysis    Component Value Date/Time   COLORURINE STRAW (A) 01/29/2021 2033   APPEARANCEUR CLEAR 01/29/2021 2033   APPEARANCEUR Clear 10/16/2020 1346   LABSPEC 1.004 (L) 01/29/2021 2033   PHURINE 6.0 01/29/2021 2033   GLUCOSEU NEGATIVE 01/29/2021 2033   HGBUR NEGATIVE 01/29/2021 2033   BILIRUBINUR negative 04/12/2021 1954   BILIRUBINUR negative 11/08/2020 1024   BILIRUBINUR Negative 10/16/2020 1346   KETONESUR trace (5) (A) 04/12/2021 1954   KETONESUR NEGATIVE 01/29/2021 2033   PROTEINUR =100 (A) 04/12/2021 1954   PROTEINUR NEGATIVE 01/29/2021 2033   UROBILINOGEN 0.2 04/12/2021 1954   UROBILINOGEN 0.2 09/28/2019 1151   NITRITE Negative 04/12/2021 1954   NITRITE NEGATIVE 01/29/2021 2033   LEUKOCYTESUR Large (3+) (A) 04/12/2021 1954   LEUKOCYTESUR NEGATIVE 01/29/2021 2033    Lab Results  Component Value Date   LABMICR See below: 10/16/2020   WBCUA 0-5 10/16/2020   LABEPIT 0-10 10/16/2020   BACTERIA RARE (A) 10/17/2020    Pertinent Imaging: Renal US 06/30/2021 Images reviewed and discussed with the patient No results found for this or any previous visit.  No results found for this or any previous visit.  No results found for this or any previous visit.  No results found for this or any previous visit.  Results for orders placed during the hospital encounter of 06/29/21  Ultrasound renal complete  Narrative CLINICAL DATA:  History of nephrolithiasis.  EXAM: RENAL / URINARY TRACT ULTRASOUND COMPLETE  COMPARISON:  None.  FINDINGS: Right Kidney:  Renal measurements: 10.4 x 5.9 x 6.5 cm = volume: 209 mL. Echogenicity within  normal limits. No mass or hydronephrosis visualized.  Left Kidney:  Renal measurements: 10.1 x 5.7 x 6.4 cm = volume: 191 mL. Multiple shadowing stones identified with the largest measuring 9.9 mm. Mild hydronephrosis.  Bladder:  Appears normal for degree of bladder distention.  Other:  None.  IMPRESSION: 1. Multiple stones seen in the left kidney with the largest measuring 9.9 mm. 2. Mild hydronephrosis on the left new compared to November 01, 2020 but otherwise age indeterminate. Recommend clinical correlation. If there is concern for acute obstruction, a CT scan could better evaluate. 3. No abnormalities in the right kidney.   Electronically Signed By: Gerome Sam III M.D. On: 06/30/2021 13:07  No results found for this or any previous visit.  No results found for this or any previous visit.  No results found for this or any previous visit.   Assessment & Plan:    1. Nephrolithiasis -We discussed the management of kidney stones. These options include observation, ureteroscopy, shockwave lithotripsy (ESWL) and percutaneous nephrolithotomy (PCNL). We discussed which options are relevant to the patient's stone(s). We discussed the natural history of kidney stones as well as the complications of untreated stones and the impact on quality of life without treatment as well as with each of the above listed treatments. We also discussed the efficacy of each treatment in its ability to clear the stone burden. With any of these management options I discussed the signs and symptoms of infection and the need for emergent treatment should these be experienced. For each option we discussed the ability of each procedure to clear the patient of their stone burden.   For observation I described the risks which include but are not limited to silent renal damage, life-threatening infection, need for emergent surgery, failure to pass stone and pain.   For ureteroscopy I described the  risks which include bleeding, infection, damage to contiguous structures, positioning injury, ureteral stricture, ureteral avulsion, ureteral injury, need for prolonged ureteral stent, inability to perform ureteroscopy, need for an interval procedure, inability to clear stone burden, stent discomfort/pain, heart attack, stroke, pulmonary embolus and the inherent risks with general anesthesia.   For shockwave lithotripsy I described the risks which include arrhythmia, kidney contusion, kidney hemorrhage, need for transfusion, pain, inability to adequately break up stone, inability to pass stone fragments, Steinstrasse, infection associated with obstructing stones, need for alternate surgical procedure, need for repeat shockwave lithotripsy, MI, CVA, PE and the inherent risks with anesthesia/conscious sedation.   For PCNL I described the risks including positioning injury, pneumothorax, hydrothorax, need for chest tube, inability to clear stone burden, renal laceration, arterial venous fistula or malformation, need for embolization of kidney, loss of kidney or renal function, need for repeat procedure, need for prolonged nephrostomy tube, ureteral avulsion, MI, CVA, PE and the inherent risks of general anesthesia.   - The patient would like to proceed with left ureteroscopic stone extraction   No follow-ups on file.  Wilkie Aye, MD  [facility]

## 2021-07-05 DIAGNOSIS — Z419 Encounter for procedure for purposes other than remedying health state, unspecified: Secondary | ICD-10-CM | POA: Diagnosis not present

## 2021-07-16 ENCOUNTER — Telehealth: Payer: Self-pay

## 2021-07-16 NOTE — Telephone Encounter (Signed)
-----   Message from Nobie Putnam sent at 07/13/2021 11:35 AM EDT ----- Theresa Shepherd,  I called this patient to schedule her PAT and she stated that she needs a different day.  I advised her to call your office.  Thanks,  Eber Jones

## 2021-07-16 NOTE — Telephone Encounter (Signed)
Called patient. Left message to return call to have surgery reschedule.

## 2021-07-19 NOTE — Telephone Encounter (Signed)
Surgery date rescheduled with patient by Eber Jones

## 2021-07-26 NOTE — Patient Instructions (Signed)
Theresa Shepherd  07/26/2021     @PREFPERIOPPHARMACY @   Your procedure is scheduled on 08/02/2021.   Report to [facility] at  [phone] A.M.   Call this number if you have problems the morning of surgery:  [phone]   Remember:  Do not eat or drink after midnight.      Take these medicines the morning of surgery with A SIP OF WATER                             pyridium.     Do not wear jewelry, make-up or nail polish.  Do not wear lotions, powders, or perfumes, or deodorant.  Do not shave 48 hours prior to surgery.  Men may shave face and neck.  Do not bring valuables to the hospital.  [facility] is not responsible for any belongings or valuables.  Contacts, dentures or bridgework may not be worn into surgery.  Leave your suitcase in the car.  After surgery it may be brought to your room.  For patients admitted to the hospital, discharge time will be determined by your treatment team.  Patients discharged the day of surgery will not be allowed to drive home and must have someone with them for 24 hours.    Special instructions:  DO NOT smoke tobacco or vape for 24 hours before your procedure.  Please read over the following fact sheets that you were given. Coughing and Deep Breathing, Surgical Site Infection Prevention, Anesthesia Post-op Instructions, and Care and Recovery After Surgery      Ureteral Stent Implantation, Care After This sheet gives you information about how to care for yourself after your procedure. Your health care provider may also give you more specific instructions. If you have problems or questions, contact your health care provider. What can I expect after the procedure? After the procedure, it is common to have: Nausea. Mild pain when you urinate. You may feel this pain in your lower back or lower abdomen. The pain should stop within a few minutes after you urinate. This may last for up to 1 week. A small amount of blood in your urine  for several days. Follow these instructions at home: Medicines Take over-the-counter and prescription medicines only as told by your health care provider. If you were prescribed an antibiotic medicine, take it as told by your health care provider. Do not stop taking the antibiotic even if you start to feel better. Do not drive for 24 hours if you were given a sedative during your procedure. Ask your health care provider if the medicine prescribed to you requires you to avoid driving or using heavy machinery. Activity Rest as told by your health care provider. Avoid sitting for a long time without moving. Get up to take short walks every 1-2 hours. This is important to improve blood flow and breathing. Ask for help if you feel weak or unsteady. Return to your normal activities as told by your health care provider. Ask your health care provider what activities are safe for you. General instructions  Watch for any blood in your urine. Call your health care provider if the amount of blood in your urine increases. If you have a catheter: Follow instructions from your health care provider about taking care of your catheter and collection bag. Do not take baths, swim, or use a hot tub until your health care provider approves.  Ask your health care provider if you may take showers. You may only be allowed to take sponge baths. Drink enough fluid to keep your urine pale yellow. Do not use any products that contain nicotine or tobacco, such as cigarettes, e-cigarettes, and chewing tobacco. These can delay healing after surgery. If you need help quitting, ask your health care provider. Keep all follow-up visits as told by your health care provider. This is important. Contact a health care provider if: You have pain that gets worse or does not get better with medicine, especially pain when you urinate. You have difficulty urinating. You feel nauseous or you vomit repeatedly during a period of more than 2  days after the procedure. Get help right away if: Your urine is dark red or has blood clots in it. You are leaking urine (have incontinence). The end of the stent comes out of your urethra. You cannot urinate. You have sudden, sharp, or severe pain in your abdomen or lower back. You have a fever. You have swelling or pain in your legs. You have difficulty breathing. Summary After the procedure, it is common to have mild pain when you urinate that goes away within a few minutes after you urinate. This may last for up to 1 week. Watch for any blood in your urine. Call your health care provider if the amount of blood in your urine increases. Take over-the-counter and prescription medicines only as told by your health care provider. Drink enough fluid to keep your urine pale yellow. This information is not intended to replace advice given to you by your health care provider. Make sure you discuss any questions you have with your health care provider. Document Revised: 07/28/2018 Document Reviewed: 07/29/2018 Elsevier Patient Education  2022 [employer] General Anesthesia, Adult, Care After This sheet gives you information about how to care for yourself after your procedure. Your health care provider may also give you more specific instructions. If you have problems or questions, contact your health care provider. What can I expect after the procedure? After the procedure, the following side effects are common: Pain or discomfort at the IV site. Nausea. Vomiting. Sore throat. Trouble concentrating. Feeling cold or chills. Feeling weak or tired. Sleepiness and fatigue. Soreness and body aches. These side effects can affect parts of the body that were not involved in surgery. Follow these instructions at home: For the time period you were told by your health care provider:  Rest. Do not participate in activities where you could fall or become injured. Do not drive or use  machinery. Do not drink alcohol. Do not take sleeping pills or medicines that cause drowsiness. Do not make important decisions or sign legal documents. Do not take care of children on your own. Eating and drinking Follow any instructions from your health care provider about eating or drinking restrictions. When you feel hungry, start by eating small amounts of foods that are soft and easy to digest (bland), such as toast. Gradually return to your regular diet. Drink enough fluid to keep your urine pale yellow. If you vomit, rehydrate by drinking water, juice, or clear broth. General instructions If you have sleep apnea, surgery and certain medicines can increase your risk for breathing problems. Follow instructions from your health care provider about wearing your sleep device: Anytime you are sleeping, including during daytime naps. While taking prescription pain medicines, sleeping medicines, or medicines that make you drowsy. Have a responsible adult stay with you for the time you are  told. It is important to have someone help care for you until you are awake and alert. Return to your normal activities as told by your health care provider. Ask your health care provider what activities are safe for you. Take over-the-counter and prescription medicines only as told by your health care provider. If you smoke, do not smoke without supervision. Keep all follow-up visits as told by your health care provider. This is important. Contact a health care provider if: You have nausea or vomiting that does not get better with medicine. You cannot eat or drink without vomiting. You have pain that does not get better with medicine. You are unable to pass urine. You develop a skin rash. You have a fever. You have redness around your IV site that gets worse. Get help right away if: You have difficulty breathing. You have chest pain. You have blood in your urine or stool, or you vomit  blood. Summary After the procedure, it is common to have a sore throat or nausea. It is also common to feel tired. Have a responsible adult stay with you for the time you are told. It is important to have someone help care for you until you are awake and alert. When you feel hungry, start by eating small amounts of foods that are soft and easy to digest (bland), such as toast. Gradually return to your regular diet. Drink enough fluid to keep your urine pale yellow. Return to your normal activities as told by your health care provider. Ask your health care provider what activities are safe for you. This information is not intended to replace advice given to you by your health care provider. Make sure you discuss any questions you have with your health care provider. Document Revised: 07/06/2020 Document Reviewed: 02/03/2020 Elsevier Patient Education  2022 Else[employer] Inc. How to Use Chlorhexidine for Bathing Chlorhexidine gluconate (CHG) is a germ-killing (antiseptic) solution that is used to clean the skin. It can get rid of the bacteria that normally live on the skin and can keep them away for about 24 hours. To clean your skin with CHG, you may be given: A CHG solution to use in the shower or as part of a sponge bath. A prepackaged cloth that contains CHG. Cleaning your skin with CHG may help lower the risk for infection: While you are staying in the intensive care unit of the hospital. If you have a vascular access, such as a central line, to provide short-term or long-term access to your veins. If you have a catheter to drain urine from your bladder. If you are on a ventilator. A ventilator is a machine that helps you breathe by moving air in and out of your lungs. After surgery. What are the risks? Risks of using CHG include: A skin reaction. Hearing loss, if CHG gets in your ears and you have a perforated eardrum. Eye injury, if CHG gets in your eyes and is not rinsed out. The CHG  product catching fire. Make sure that you avoid smoking and flames after applying CHG to your skin. Do not use CHG: If you have a chlorhexidine allergy or have previously reacted to chlorhexidine. On babies younger than 36 months of age. How to use CHG solution Use CHG only as told by your health care provider, and follow the instructions on the label. Use the full amount of CHG as directed. Usually, this is one bottle. During a shower Follow these steps when using CHG solution during a shower (unless  your health care provider gives you different instructions): Start the shower. Use your normal soap and shampoo to wash your face and hair. Turn off the shower or move out of the shower stream. Pour the CHG onto a clean washcloth. Do not use any type of brush or rough-edged sponge. Starting at your neck, lather your body down to your toes. Make sure you follow these instructions: If you will be having surgery, pay special attention to the part of your body where you will be having surgery. Scrub this area for at least 1 minute. Do not use CHG on your head or face. If the solution gets into your ears or eyes, rinse them well with water. Avoid your genital area. Avoid any areas of skin that have broken skin, cuts, or scrapes. Scrub your back and under your arms. Make sure to wash skin folds. Let the lather sit on your skin for 1-2 minutes or as long as told by your health care provider. Thoroughly rinse your entire body in the shower. Make sure that all body creases and crevices are rinsed well. Dry off with a clean towel. Do not put any substances on your body afterward--such as powder, lotion, or perfume--unless you are told to do so by your health care provider. Only use lotions that are recommended by the manufacturer. Put on clean clothes or pajamas. If it is the night before your surgery, sleep in clean sheets.  During a sponge bath Follow these steps when using CHG solution during a  sponge bath (unless your health care provider gives you different instructions): Use your normal soap and shampoo to wash your face and hair. Pour the CHG onto a clean washcloth. Starting at your neck, lather your body down to your toes. Make sure you follow these instructions: If you will be having surgery, pay special attention to the part of your body where you will be having surgery. Scrub this area for at least 1 minute. Do not use CHG on your head or face. If the solution gets into your ears or eyes, rinse them well with water. Avoid your genital area. Avoid any areas of skin that have broken skin, cuts, or scrapes. Scrub your back and under your arms. Make sure to wash skin folds. Let the lather sit on your skin for 1-2 minutes or as long as told by your health care provider. Using a different clean, wet washcloth, thoroughly rinse your entire body. Make sure that all body creases and crevices are rinsed well. Dry off with a clean towel. Do not put any substances on your body afterward--such as powder, lotion, or perfume--unless you are told to do so by your health care provider. Only use lotions that are recommended by the manufacturer. Put on clean clothes or pajamas. If it is the night before your surgery, sleep in clean sheets. How to use CHG prepackaged cloths Only use CHG cloths as told by your health care provider, and follow the instructions on the label. Use the CHG cloth on clean, dry skin. Do not use the CHG cloth on your head or face unless your health care provider tells you to. When washing with the CHG cloth: Avoid your genital area. Avoid any areas of skin that have broken skin, cuts, or scrapes. Before surgery Follow these steps when using a CHG cloth to clean before surgery (unless your health care provider gives you different instructions): Using the CHG cloth, vigorously scrub the part of your body where you will be  having surgery. Scrub using a back-and-forth motion  for 3 minutes. The area on your body should be completely wet with CHG when you are done scrubbing. Do not rinse. Discard the cloth and let the area air-dry. Do not put any substances on the area afterward, such as powder, lotion, or perfume. Put on clean clothes or pajamas. If it is the night before your surgery, sleep in clean sheets.  For general bathing Follow these steps when using CHG cloths for general bathing (unless your health care provider gives you different instructions). Use a separate CHG cloth for each area of your body. Make sure you wash between any folds of skin and between your fingers and toes. Wash your body in the following order, switching to a new cloth after each step: The front of your neck, shoulders, and chest. Both of your arms, under your arms, and your hands. Your stomach and groin area, avoiding the genitals. Your right leg and foot. Your left leg and foot. The back of your neck, your back, and your buttocks. Do not rinse. Discard the cloth and let the area air-dry. Do not put any substances on your body afterward--such as powder, lotion, or perfume--unless you are told to do so by your health care provider. Only use lotions that are recommended by the manufacturer. Put on clean clothes or pajamas. Contact a health care provider if: Your skin gets irritated after scrubbing. You have questions about using your solution or cloth. You swallow any chlorhexidine. Call your local poison control center ([phone] in the [place] Get help right away if: Your eyes itch badly, or they become very red or swollen. Your skin itches badly and is red or swollen. Your hearing changes. You have trouble seeing. You have swelling or tingling in your mouth or throat. You have trouble breathing. These symptoms may represent a serious problem that is an emergency. Do not wait to see if the symptoms will go away. Get medical help right away. Call your local emergency services  (911 in the [place] Do not drive yourself to the hospital. Summary Chlorhexidine gluconate (CHG) is a germ-killing (antiseptic) solution that is used to clean the skin. Cleaning your skin with CHG may help to lower your risk for infection. You may be given CHG to use for bathing. It may be in a bottle or in a prepackaged cloth to use on your skin. Carefully follow your health care provider's instructions and the instructions on the product label. Do not use CHG if you have a chlorhexidine allergy. Contact your health care provider if your skin gets irritated after scrubbing. This information is not intended to replace advice given to you by your health care provider. Make sure you discuss any questions you have with your health care provider. Document Revised: 01/01/2021 Document Reviewed: 01/01/2021 Elsevier Patient Education  2022 [employer].

## 2021-07-30 ENCOUNTER — Telehealth: Payer: Self-pay

## 2021-07-30 ENCOUNTER — Ambulatory Visit: Payer: Medicaid Other | Admitting: Urology

## 2021-07-30 NOTE — Telephone Encounter (Signed)
Patient needing to know about missing appt today due to work schedule and upcoming procedure date.  Thanks, Rosey Bath

## 2021-07-30 NOTE — Telephone Encounter (Signed)
Patient cannot make the appt today due to work schedule. She needs to know if this will change the upcoming procedure  She needs a call back to find out asap.  Please advise.  Call back: [phone] (H)   Thanks, Rosey Bath

## 2021-07-31 ENCOUNTER — Encounter (HOSPITAL_COMMUNITY): Payer: Self-pay

## 2021-07-31 ENCOUNTER — Other Ambulatory Visit: Payer: Self-pay

## 2021-07-31 ENCOUNTER — Telehealth: Payer: Self-pay

## 2021-07-31 ENCOUNTER — Encounter (HOSPITAL_COMMUNITY)
Admission: RE | Admit: 2021-07-31 | Discharge: 2021-07-31 | Disposition: A | Discharge status: Home / Self Care | Payer: Medicaid Other | Source: Ambulatory Visit | Attending: Urology | Admitting: Urology

## 2021-07-31 DIAGNOSIS — Z01812 Encounter for preprocedural laboratory examination: Secondary | ICD-10-CM | POA: Insufficient documentation

## 2021-07-31 LAB — CBC WITH DIFFERENTIAL/PLATELET
Abs Immature Granulocytes: 0.01 10*3/uL (ref 0.00–0.07)
Basophils Absolute: 0 10*3/uL (ref 0.0–0.1)
Basophils Relative: 0 %
Eosinophils Absolute: 0.3 10*3/uL (ref 0.0–0.5)
Eosinophils Relative: 4 %
HCT: 40.3 % (ref 36.0–46.0)
Hemoglobin: 13.4 g/dL (ref 12.0–15.0)
Immature Granulocytes: 0 %
Lymphocytes Relative: 14 %
Lymphs Abs: 1 10*3/uL (ref 0.7–4.0)
MCH: 29.7 pg (ref 26.0–34.0)
MCHC: 33.3 g/dL (ref 30.0–36.0)
MCV: 89.4 fL (ref 80.0–100.0)
Monocytes Absolute: 0.8 10*3/uL (ref 0.1–1.0)
Monocytes Relative: 11 %
Neutro Abs: 5.3 10*3/uL (ref 1.7–7.7)
Neutrophils Relative %: 71 %
Platelets: 214 10*3/uL (ref 150–400)
RBC: 4.51 MIL/uL (ref 3.87–5.11)
RDW: 12.9 % (ref 11.5–15.5)
WBC: 7.5 10*3/uL (ref 4.0–10.5)
nRBC: 0 % (ref 0.0–0.2)

## 2021-07-31 LAB — HCG, SERUM, QUALITATIVE: Preg, Serum: NEGATIVE

## 2021-07-31 NOTE — Telephone Encounter (Signed)
See other task

## 2021-07-31 NOTE — Telephone Encounter (Signed)
Patient called advising that she received a call to confirm she wanted to cancel surgery. She advised she wanted to proceed with the surgery and would be going to her pre op appt today. She just wanted to make you aware.

## 2021-07-31 NOTE — Telephone Encounter (Signed)
Returned patient call. Went to Recruitment consultant. Left message to return office call.

## 2021-08-02 ENCOUNTER — Ambulatory Visit (HOSPITAL_COMMUNITY): Payer: Medicaid Other | Admitting: Anesthesiology

## 2021-08-02 ENCOUNTER — Ambulatory Visit (HOSPITAL_COMMUNITY)
Admission: RE | Admit: 2021-08-02 | Discharge: 2021-08-02 | Disposition: A | Discharge status: Home / Self Care | Payer: Medicaid Other | Attending: Urology | Admitting: Urology

## 2021-08-02 ENCOUNTER — Ambulatory Visit (HOSPITAL_COMMUNITY): Payer: Medicaid Other

## 2021-08-02 ENCOUNTER — Encounter (HOSPITAL_COMMUNITY): Payer: Self-pay | Admitting: Urology

## 2021-08-02 ENCOUNTER — Encounter (HOSPITAL_COMMUNITY)
Admission: RE | Disposition: A | Discharge status: Home / Self Care | Payer: Self-pay | Source: Home / Self Care | Attending: Urology

## 2021-08-02 ENCOUNTER — Other Ambulatory Visit: Payer: Self-pay

## 2021-08-02 DIAGNOSIS — Z881 Allergy status to other antibiotic agents status: Secondary | ICD-10-CM | POA: Insufficient documentation

## 2021-08-02 DIAGNOSIS — N200 Calculus of kidney: Secondary | ICD-10-CM | POA: Insufficient documentation

## 2021-08-02 DIAGNOSIS — J45909 Unspecified asthma, uncomplicated: Secondary | ICD-10-CM | POA: Insufficient documentation

## 2021-08-02 DIAGNOSIS — E119 Type 2 diabetes mellitus without complications: Secondary | ICD-10-CM | POA: Insufficient documentation

## 2021-08-02 HISTORY — PX: CYSTOSCOPY WITH RETROGRADE PYELOGRAM, URETEROSCOPY AND STENT PLACEMENT: SHX5789

## 2021-08-02 SURGERY — CYSTOURETEROSCOPY, WITH RETROGRADE PYELOGRAM AND STENT INSERTION
Anesthesia: General | Site: Ureter | Laterality: Left

## 2021-08-02 MED ORDER — PROPOFOL 10 MG/ML IV BOLUS
INTRAVENOUS | Status: AC
  Filled 2021-08-02: qty 40

## 2021-08-02 MED ORDER — ORAL CARE MOUTH RINSE: 15.0000 mL | Freq: Once | OROMUCOSAL | Status: AC

## 2021-08-02 MED ORDER — ONDANSETRON HCL 4 MG/2ML IJ SOLN
INTRAMUSCULAR | Status: DC | PRN
  Administered 2021-08-02: 4 mg via INTRAVENOUS

## 2021-08-02 MED ORDER — ONDANSETRON HCL 4 MG/2ML IJ SOLN
4.0000 mg | Freq: Once | INTRAMUSCULAR | Status: AC | PRN
  Administered 2021-08-02: 4 mg via INTRAVENOUS
  Filled 2021-08-02: qty 2

## 2021-08-02 MED ORDER — MEPERIDINE HCL 50 MG/ML IJ SOLN: 6.2500 mg | INTRAMUSCULAR | Status: DC | PRN

## 2021-08-02 MED ORDER — LIDOCAINE HCL (CARDIAC) PF 100 MG/5ML IV SOSY
PREFILLED_SYRINGE | INTRAVENOUS | Status: DC | PRN
  Administered 2021-08-02: 60 mg via INTRATRACHEAL

## 2021-08-02 MED ORDER — DIATRIZOATE MEGLUMINE 30 % UR SOLN
URETHRAL | Status: AC
  Filled 2021-08-02: qty 100

## 2021-08-02 MED ORDER — LIDOCAINE HCL (PF) 2 % IJ SOLN
INTRAMUSCULAR | Status: AC
  Filled 2021-08-02: qty 5

## 2021-08-02 MED ORDER — DEXAMETHASONE SODIUM PHOSPHATE 10 MG/ML IJ SOLN
INTRAMUSCULAR | Status: DC | PRN
  Administered 2021-08-02: 10 mg via INTRAVENOUS

## 2021-08-02 MED ORDER — FENTANYL CITRATE (PF) 250 MCG/5ML IJ SOLN
INTRAMUSCULAR | Status: AC
  Filled 2021-08-02: qty 5

## 2021-08-02 MED ORDER — HYDROMORPHONE HCL 1 MG/ML IJ SOLN: 0.2500 mg | INTRAMUSCULAR | Status: DC | PRN

## 2021-08-02 MED ORDER — OXYCODONE-ACETAMINOPHEN 5-325 MG PO TABS
1.0000 | ORAL_TABLET | Freq: Once | ORAL | Status: AC
  Administered 2021-08-02: 1 via ORAL
  Filled 2021-08-02: qty 1

## 2021-08-02 MED ORDER — SUCCINYLCHOLINE CHLORIDE 200 MG/10ML IV SOSY
PREFILLED_SYRINGE | INTRAVENOUS | Status: AC
  Filled 2021-08-02: qty 10

## 2021-08-02 MED ORDER — LACTATED RINGERS IV SOLN
INTRAVENOUS | Status: DC
  Administered 2021-08-02: 1000 mL via INTRAVENOUS

## 2021-08-02 MED ORDER — ONDANSETRON HCL 4 MG/2ML IJ SOLN
INTRAMUSCULAR | Status: AC
  Filled 2021-08-02: qty 2

## 2021-08-02 MED ORDER — MIDAZOLAM HCL 2 MG/2ML IJ SOLN
INTRAMUSCULAR | Status: AC
  Filled 2021-08-02: qty 2

## 2021-08-02 MED ORDER — SUCCINYLCHOLINE CHLORIDE 200 MG/10ML IV SOSY
PREFILLED_SYRINGE | INTRAVENOUS | Status: DC | PRN
  Administered 2021-08-02: 140 mg via INTRAVENOUS

## 2021-08-02 MED ORDER — CEFAZOLIN SODIUM-DEXTROSE 2-4 GM/100ML-% IV SOLN
2.0000 g | INTRAVENOUS | Status: AC
  Administered 2021-08-02: 2 g via INTRAVENOUS

## 2021-08-02 MED ORDER — PROPOFOL 10 MG/ML IV BOLUS
INTRAVENOUS | Status: DC | PRN
  Administered 2021-08-02: 200 mg via INTRAVENOUS

## 2021-08-02 MED ORDER — MIDAZOLAM HCL 2 MG/2ML IJ SOLN
INTRAMUSCULAR | Status: DC | PRN
  Administered 2021-08-02: 2 mg via INTRAVENOUS

## 2021-08-02 MED ORDER — WATER FOR IRRIGATION, STERILE IR SOLN
Status: DC | PRN
  Administered 2021-08-02: 1000 mL

## 2021-08-02 MED ORDER — SODIUM CHLORIDE 0.9 % IR SOLN
Status: DC | PRN
  Administered 2021-08-02: 3000 mL

## 2021-08-02 MED ORDER — FENTANYL CITRATE (PF) 100 MCG/2ML IJ SOLN
INTRAMUSCULAR | Status: DC | PRN
  Administered 2021-08-02: 100 ug via INTRAVENOUS
  Administered 2021-08-02: 50 ug via INTRAVENOUS

## 2021-08-02 MED ORDER — CHLORHEXIDINE GLUCONATE 0.12 % MT SOLN
15.0000 mL | Freq: Once | OROMUCOSAL | Status: AC
  Administered 2021-08-02: 15 mL via OROMUCOSAL
  Filled 2021-08-02: qty 15

## 2021-08-02 MED ORDER — DIATRIZOATE MEGLUMINE 30 % UR SOLN
URETHRAL | Status: DC | PRN
  Administered 2021-08-02: 9 mL via URETHRAL

## 2021-08-02 MED ORDER — OXYCODONE-ACETAMINOPHEN 5-325 MG PO TABS: 1.0000 | ORAL_TABLET | ORAL | 0 refills | Status: DC | PRN

## 2021-08-02 MED ORDER — ONDANSETRON HCL 4 MG PO TABS: 4.0000 mg | ORAL_TABLET | Freq: Every day | ORAL | 1 refills | Status: DC | PRN

## 2021-08-02 SURGICAL SUPPLY — 28 items
BAG DRAIN URO TABLE W/ADPT NS (BAG) ×3 IMPLANT
BAG DRN 8 ADPR NS SKTRN CSTL (BAG) ×2
BAG HAMPER (MISCELLANEOUS) ×3 IMPLANT
CATH INTERMIT  6FR 70CM (CATHETERS) ×3 IMPLANT
CLOTH BEACON ORANGE TIMEOUT ST (SAFETY) ×3 IMPLANT
DECANTER SPIKE VIAL GLASS SM (MISCELLANEOUS) ×3 IMPLANT
EXTRACTOR STONE NITINOL NGAGE (UROLOGICAL SUPPLIES) ×3 IMPLANT
GLOVE SURG POLYISO LF SZ8 (GLOVE) ×3 IMPLANT
GLOVE SURG UNDER POLY LF SZ7 (GLOVE) ×6 IMPLANT
GOWN STRL REUS W/TWL LRG LVL3 (GOWN DISPOSABLE) ×3 IMPLANT
GOWN STRL REUS W/TWL XL LVL3 (GOWN DISPOSABLE) ×3 IMPLANT
GUIDEWIRE STR DUAL SENSOR (WIRE) ×3 IMPLANT
GUIDEWIRE STR ZIPWIRE 035X150 (MISCELLANEOUS) ×3 IMPLANT
IV NS IRRIG 3000ML ARTHROMATIC (IV SOLUTION) ×6 IMPLANT
KIT TURNOVER CYSTO (KITS) ×3 IMPLANT
MANIFOLD NEPTUNE II (INSTRUMENTS) ×3 IMPLANT
PACK CYSTO (CUSTOM PROCEDURE TRAY) ×3 IMPLANT
PAD ARMBOARD 7.5X6 YLW CONV (MISCELLANEOUS) ×3 IMPLANT
SHEATH URETERAL 12FRX35CM (MISCELLANEOUS) ×3 IMPLANT
STENT CONTOUR 6FRX24X.038 (STENTS) ×1
STENT URET 6FRX24 CONTOUR (STENTS) ×3 IMPLANT
STENT URET 6FRX26 CONTOUR (STENTS) IMPLANT
SYR 10ML LL (SYRINGE) ×3 IMPLANT
SYR CONTROL 10ML LL (SYRINGE) ×3 IMPLANT
TOWEL OR 17X26 4PK STRL BLUE (TOWEL DISPOSABLE) ×3 IMPLANT
TRACTIP FLEXIVA PULS ID 200XHI (Laser) IMPLANT
TRACTIP FLEXIVA PULSE ID 200 (Laser)
WATER STERILE IRR 500ML POUR (IV SOLUTION) ×3 IMPLANT

## 2021-08-02 NOTE — Transfer of Care (Signed)
Immediate Anesthesia Transfer of Care Note  Patient: Theresa Shepherd  Procedure(s) Performed: CYSTOSCOPY WITH RETROGRADE PYELOGRAM, URETEROSCOPY AND STENT PLACEMENT (Left: Ureter) HOLMIUM LASER APPLICATION (Left: Ureter)  Patient Location: PACU  Anesthesia Type:General  Level of Consciousness: drowsy  Airway & Oxygen Therapy: Patient Spontanous Breathing and Patient connected to nasal cannula oxygen  Post-op Assessment: Report given to RN and Post -op Vital signs reviewed and stable  Post vital signs: Reviewed and stable  Last Vitals:  Vitals Value Taken Time  BP 125/78   Temp    Pulse 85   Resp    SpO2 100%     Last Pain:  Vitals:   08/02/21 0721  TempSrc: Oral  PainSc: 0-No pain      Patients Stated Pain Goal: 7 (08/02/21 0721)  Complications: No notable events documented.

## 2021-08-02 NOTE — Anesthesia Postprocedure Evaluation (Signed)
Anesthesia Post Note  Patient: Theresa Shepherd  Procedure(s) Performed: CYSTOSCOPY WITH RETROGRADE PYELOGRAM, URETEROSCOPY AND STENT PLACEMENT (Left: Ureter)  Patient location during evaluation: PACU Anesthesia Type: General Level of consciousness: awake and alert and oriented Pain management: pain level controlled Vital Signs Assessment: post-procedure vital signs reviewed and stable Respiratory status: spontaneous breathing and respiratory function stable Cardiovascular status: blood pressure returned to baseline and stable Postop Assessment: no apparent nausea or vomiting Anesthetic complications: no   No notable events documented.   Last Vitals:  Vitals:   08/02/21 1000 08/02/21 1007  BP: 121/80 130/82  Pulse: 61 72  Resp: 12 18  Temp:  36.7 C  SpO2: 100% 100%    Last Pain:  Vitals:   08/02/21 1007  TempSrc: Oral  PainSc: 9                  Carmine Carrozza C Dajuan Turnley

## 2021-08-02 NOTE — Interval H&P Note (Signed)
History and Physical Interval Note:  08/02/2021 7:41 AM  Theresa Shepherd  has presented today for surgery, with the diagnosis of left renal calculi.  The various methods of treatment have been discussed with the patient and family. After consideration of risks, benefits and other options for treatment, the patient has consented to  Procedure(s): CYSTOSCOPY WITH RETROGRADE PYELOGRAM, URETEROSCOPY AND STENT PLACEMENT (Left) HOLMIUM LASER APPLICATION (Left) as a surgical intervention.  The patient's history has been reviewed, patient examined, no change in status, stable for surgery.  I have reviewed the patient's chart and labs.  Questions were answered to the patient's satisfaction.     Wilkie Aye

## 2021-08-02 NOTE — Anesthesia Preprocedure Evaluation (Signed)
Anesthesia Evaluation  Patient identified by MRN, date of birth, ID band Patient awake    Reviewed: Allergy & Precautions, NPO status , Patient's Chart, lab work & pertinent test results  Airway Mallampati: II  TM Distance: >3 FB Neck ROM: Full    Dental  (+) Dental Advisory Given, Teeth Intact   Pulmonary asthma ,    Pulmonary exam normal breath sounds clear to auscultation       Cardiovascular Exercise Tolerance: Good Normal cardiovascular exam Rhythm:Regular Rate:Normal  20-Oct-2018 00:46:29 [facility]/ED ROUTINE RECORD Normal sinus rhythm Normal ECG No previous ECGs available Confirmed by Glynn Octave [id number]) on 10/20/2018 4:57:30 AM Also confirmed by Glynn Octave [phone]), editor Shea Evans, Shanda Bumps ([id number]) on 10/20/2018 7:24:02 AM   Neuro/Psych negative neurological ROS  negative psych ROS   GI/Hepatic negative GI ROS, (+)     substance abuse  alcohol use and marijuana use,   Endo/Other  diabetes  Renal/GU Renal disease (stones)     Musculoskeletal negative musculoskeletal ROS (+)   Abdominal   Peds  Hematology negative hematology ROS (+)   Anesthesia Other Findings Patient ate food at midnight, will delay the procedure until 8:30  Reproductive/Obstetrics negative OB ROS                           Anesthesia Physical Anesthesia Plan  ASA: 2  Anesthesia Plan:    Post-op Pain Management:    Induction:   PONV Risk Score and Plan: 4 or greater and Ondansetron, Dexamethasone and Midazolam  Airway Management Planned: Oral ETT and LMA  Additional Equipment:   Intra-op Plan:   Post-operative Plan:   Informed Consent:   Plan Discussed with: CRNA and Surgeon  Anesthesia Plan Comments:        Anesthesia Quick Evaluation

## 2021-08-02 NOTE — Anesthesia Procedure Notes (Signed)
Procedure Name: Intubation Date/Time: 08/02/2021 8:38 AM Performed by: Karna Dupes, CRNA Pre-anesthesia Checklist: Patient identified, Emergency Drugs available, Suction available and Patient being monitored Patient Re-evaluated:Patient Re-evaluated prior to induction Oxygen Delivery Method: Circle system utilized Preoxygenation: Pre-oxygenation with 100% oxygen Induction Type: IV induction, Rapid sequence and Cricoid Pressure applied Laryngoscope Size: Mac and 3 Grade View: Grade I Tube type: Oral Tube size: 7.0 mm Number of attempts: 1 Airway Equipment and Method: Stylet Placement Confirmation: ETT inserted through vocal cords under direct vision, positive ETCO2 and breath sounds checked- equal and bilateral Secured at: 21 cm Tube secured with: Tape Dental Injury: Teeth and Oropharynx as per pre-operative assessment

## 2021-08-02 NOTE — Op Note (Signed)
Preoperative diagnosis: Left renal calculi  Postoperative diagnosis: Same  Procedure: 1 cystoscopy 2. Left retrograde pyelography 3.  Intraoperative fluoroscopy, under one hour, with interpretation 4.  Left ureteroscopic stone manipulation with basket extraction 5.  Left 6 x 24 JJ stent placement  Attending: Cleda Mccreedy  Anesthesia: General  Estimated blood loss: None  Drains: Left 6 x 24 JJ ureteral stent with tether  Specimens: stone for analysis  Antibiotics: ancef  Findings: left upper mid and lower pole calculi. No hydronephrosis. No masses/lesions in the bladder. Ureteral orifices in normal anatomic location.  Indications: Patient is a 35 year old female with a history of left renal stones and who has persistent left flank pain.  After discussing treatment options, she decided proceed with left ureteroscopic stone manipulation.  Procedure in detail: The patient was brought to the operating room and a brief timeout was done to ensure correct patient, correct procedure, correct site.  General anesthesia was administered patient was placed in dorsal lithotomy position.  Her genitalia was then prepped and draped in usual sterile fashion.  A rigid 22 French cystoscope was passed in the urethra and the bladder.  Bladder was inspected free masses or lesions.  the ureteral orifices were in the normal orthotopic locations.  a 6 french ureteral catheter was then instilled into the left ureteral orifice.  a gentle retrograde was obtained and findings noted above.  we then placed a zip wire through the ureteral catheter and advanced up to the renal pelvis.  we then removed the cystoscope and cannulated the left ureteral orifice with a semirigid ureteroscope.  No stone was found in the ureter. Once we reached the UPJ a sensor wire was advanced in to the renal pelvis. We then removed the ureteroscope and advanced am 12/14 x 35cm access sheath up to the renal pelvis. We then used the flexible  ureteroscope to perform nephroscopy. We encountered the stones in multiple calyces.    the stone were then removed with a Ngage basket.    once all stones were removed we then removed the access sheath under direct vision and noted no injury to the ureter. We then placed a 6 x 24 double-j ureteral stent over the original zip wire.  We then removed the wire and good coil was noted in the the renal pelvis under fluoroscopy and the bladder under direct vision. the bladder was then drained and this concluded the procedure which was well tolerated by patient.  Complications: None  Condition: Stable, extubated, transferred to PACU  Plan: Patient is to be discharged home as to follow-up in one week. She is to remove her stent in 72 hours by pulling the tether

## 2021-08-03 ENCOUNTER — Encounter (HOSPITAL_COMMUNITY): Payer: Self-pay | Admitting: Urology

## 2021-08-03 ENCOUNTER — Other Ambulatory Visit: Payer: Self-pay | Admitting: Urology

## 2021-08-03 MED ORDER — OXYCODONE HCL 5 MG PO CAPS: 5.0000 mg | ORAL_CAPSULE | ORAL | 0 refills | Status: DC | PRN

## 2021-08-04 DIAGNOSIS — Z419 Encounter for procedure for purposes other than remedying health state, unspecified: Secondary | ICD-10-CM | POA: Diagnosis not present

## 2021-08-07 LAB — CALCULI, WITH PHOTOGRAPH (CLINICAL LAB)
Calcium Oxalate Dihydrate: 70 %
Calcium Oxalate Monohydrate: 10 %
Hydroxyapatite: 20 %
Weight Calculi: 27 mg

## 2021-08-08 ENCOUNTER — Encounter (HOSPITAL_COMMUNITY): Payer: Self-pay | Admitting: Urology

## 2021-08-26 ENCOUNTER — Ambulatory Visit
Admission: EM | Admit: 2021-08-26 | Discharge: 2021-08-26 | Disposition: A | Discharge status: Home / Self Care | Payer: Medicaid Other | Attending: Internal Medicine | Admitting: Internal Medicine

## 2021-08-26 ENCOUNTER — Other Ambulatory Visit: Payer: Self-pay

## 2021-08-26 DIAGNOSIS — N3000 Acute cystitis without hematuria: Secondary | ICD-10-CM | POA: Diagnosis not present

## 2021-08-26 DIAGNOSIS — K047 Periapical abscess without sinus: Secondary | ICD-10-CM | POA: Diagnosis not present

## 2021-08-26 DIAGNOSIS — R300 Dysuria: Secondary | ICD-10-CM | POA: Diagnosis not present

## 2021-08-26 LAB — POCT URINALYSIS DIP (MANUAL ENTRY)
Bilirubin, UA: NEGATIVE
Blood, UA: NEGATIVE
Glucose, UA: NEGATIVE mg/dL
Leukocytes, UA: NEGATIVE
Nitrite, UA: POSITIVE — AB
Spec Grav, UA: >=1.03 — AB (ref 1.010–1.025)
Urobilinogen, UA: 1 E.U./dL
pH, UA: 6 (ref 5.0–8.0)

## 2021-08-26 MED ORDER — CEPHALEXIN 500 MG PO CAPS: 500.0000 mg | ORAL_CAPSULE | Freq: Four times a day (QID) | ORAL | 0 refills | Status: DC

## 2021-08-26 MED ORDER — LIDOCAINE VISCOUS HCL 2 % MT SOLN: 15.0000 mL | OROMUCOSAL | 0 refills | Status: DC | PRN

## 2021-08-26 NOTE — ED Provider Notes (Signed)
[name]    CSN: [id number] Arrival date & time: 08/26/21  1511      History   Chief Complaint Chief Complaint  Patient presents with   Dental Pain   Dysuria    HPI Theresa Shepherd is a 35 y.o. female.   Patient presents with left-sided mouth pain that started yesterday.  Patient reports that she has noticed some swelling that occurred today.  Denies any purulent drainage to the mouth or any fevers.  Denies any injury to the mouth.  Patient also reports 3 to 4-day history of urinary burning.  Denies urinary frequency, pelvic pain, vaginal discharge, irregular vaginal bleeding, hematuria, back pain, fever.  Patient is taking Azo without a decrease in symptoms.  Patient reports that she had cystoscopy and ureteroscopy with stent placement on 08/02/2021 and was told by urologist to have urine checked if any urinary burning occurred after procedure.   Dental Pain Dysuria  Past Medical History:  Diagnosis Date   Asthma    as child   Eczema    Gestational diabetes    Infection    UTI   Kidney stones    UTI (urinary tract infection)     Patient Active Problem List   Diagnosis Date Noted   Encounter for maternal care for low transverse scar from repeat cesarean delivery 02/06/2021   S/P repeat low transverse C-section 02/06/2021   History of cesarean delivery x 1--2012, FTP 02/04/2021   History of COVID-19--12/2020 02/04/2021   History of urinary calculi--2021 02/04/2021   Gestational diabetes mellitus (GDM), antepartum 12/11/2020   GBS bacteriuria 09/19/2020    Past Surgical History:  Procedure Laterality Date   CESAREAN SECTION     CESAREAN SECTION N/A 02/06/2021   Procedure: CESAREAN SECTION;  Surgeon: Jaymes Graff, MD;  Location: [facility];  Service: Obstetrics;  Laterality: N/A;   CYSTOSCOPY WITH RETROGRADE PYELOGRAM, URETEROSCOPY AND STENT PLACEMENT Left 08/02/2021   Procedure: CYSTOSCOPY WITH RETROGRADE PYELOGRAM, URETEROSCOPY AND STENT PLACEMENT;   Surgeon: Malen Gauze, MD;  Location: [facility];  Service: Urology;  Laterality: Left;   INDUCED ABORTION      OB History     Gravida  3   Para  2   Term  2   Preterm      AB  1   Living  2      SAB      IAB  1   Ectopic      Multiple  0   Live Births  2            Home Medications    Prior to Admission medications   Medication Sig Start Date End Date Taking? Authorizing Provider  cephALEXin (KEFLEX) 500 MG capsule Take 1 capsule (500 mg total) by mouth 4 (four) times daily. 08/26/21  Yes Tenzin Pavon, Rolly Salter E, FNP  lidocaine (XYLOCAINE) 2 % solution Use as directed 15 mLs in the mouth or throat as needed for mouth pain. 08/26/21  Yes Shakeya Kerkman, Rolly Salter E, FNP  diphenhydramine-acetaminophen (TYLENOL PM) 25-500 MG TABS tablet Take 1 tablet by mouth at bedtime as needed (sleep/pain).    [provider]  ibuprofen (ADVIL) 200 MG tablet Take 600-800 mg by mouth every 6 (six) hours as needed for moderate pain or headache.    [provider]  ondansetron (ZOFRAN) 4 MG tablet Take 1 tablet (4 mg total) by mouth daily as needed for nausea or vomiting. 08/02/21 08/02/22  McKenzie, Mardene Celeste, MD  oxycodone (  OXY-IR) 5 MG capsule Take 1 capsule (5 mg total) by mouth every 4 (four) hours as needed. 08/03/21   McKenzie, Mardene Celeste, MD  oxyCODONE-acetaminophen (PERCOCET) 5-325 MG tablet Take 1 tablet by mouth every 4 (four) hours as needed for severe pain. 08/02/21 08/02/22  Malen Gauze, MD  triamcinolone cream (KENALOG) 0.1 % Apply 1 application topically 2 (two) times daily. Patient taking differently: Apply 1 application topically 2 (two) times daily as needed (dry skin). 04/12/21   Particia Nearing, PA-C    Family History Family History  Problem Relation Age of Onset   Diabetes Maternal Grandmother    Heart disease Maternal Grandmother    Asthma Other    Hypertension Other    Diabetes Other    Heart disease Paternal Grandfather    Diabetes Mother     Hypertension Mother    COPD Mother    Hypertension Father    Stomach cancer Paternal Grandmother     Social History Social History   Tobacco Use   Smoking status: Never   Smokeless tobacco: Never  Vaping Use   Vaping Use: Never used  Substance Use Topics   Alcohol use: Not Currently    Alcohol/week: 3.0 standard drinks    Types: 3 Standard drinks or equivalent per week    Comment: 2-3x/month   Drug use: Not Currently    Types: Marijuana    Comment: last use 10/17/19     Allergies   Augmentin [amoxicillin-pot clavulanate]   Review of Systems Review of Systems Per HPI  Physical Exam Triage Vital Signs ED Triage Vitals  Enc Vitals Group     BP 08/26/21 1546 109/74     Pulse Rate 08/26/21 1546 67     Resp 08/26/21 1546 18     Temp 08/26/21 1546 98.1 F (36.7 C)     Temp Source 08/26/21 1546 Oral     SpO2 08/26/21 1546 98 %     Weight --      Height --      Head Circumference --      Peak Flow --      Pain Score 08/26/21 1550 8     Pain Loc --      Pain Edu? --      Excl. in [place]? --    No data found.  Updated Vital Signs BP 109/74 (BP Location: Left Arm)   Pulse 67   Temp 98.1 F (36.7 C) (Oral)   Resp 18   SpO2 98%   Breastfeeding No   Visual Acuity Right Eye Distance:   Left Eye Distance:   Bilateral Distance:    Right Eye Near:   Left Eye Near:    Bilateral Near:     Physical Exam Constitutional:      General: She is not in acute distress.    Appearance: Normal appearance. She is not toxic-appearing or diaphoretic.  HENT:     Head: Normocephalic and atraumatic.     Mouth/Throat:     Lips: Pink.     Mouth: Mucous membranes are moist.     Dentition: Abnormal dentition. Dental tenderness, gingival swelling and dental abscesses present.     Pharynx: Oropharynx is clear. No pharyngeal swelling, oropharyngeal exudate or posterior oropharyngeal erythema.     Comments: Mild swelling and erythema noted to left inner cheek of oral mucosa that  extends into dentition. Eyes:     Extraocular Movements: Extraocular movements intact.     Conjunctiva/sclera: Conjunctivae normal.  Cardiovascular:     Rate and Rhythm: Normal rate and regular rhythm.     Pulses: Normal pulses.     Heart sounds: Normal heart sounds.  Pulmonary:     Effort: Pulmonary effort is normal. No respiratory distress.     Breath sounds: Normal breath sounds.  Abdominal:     General: Bowel sounds are normal. There is no distension.     Palpations: Abdomen is soft.     Tenderness: There is no abdominal tenderness.  Skin:    General: Skin is warm and dry.  Neurological:     General: No focal deficit present.     Mental Status: She is alert and oriented to person, place, and time. Mental status is at baseline.  Psychiatric:        Mood and Affect: Mood normal.        Behavior: Behavior normal.        Thought Content: Thought content normal.        Judgment: Judgment normal.     UC Treatments / Results  Labs (all labs ordered are listed, but only abnormal results are displayed) Labs Reviewed  POCT URINALYSIS DIP (MANUAL ENTRY) - Abnormal; Notable for the following components:      Result Value   Ketones, POC UA trace (5) (*)    Spec Grav, UA >=1.030 (*)    Protein Ur, POC trace (*)    Nitrite, UA Positive (*)    All other components within normal limits  URINE CULTURE    EKG   Radiology No results found.  Procedures Procedures (including critical care time)  Medications Ordered in UC Medications - No data to display  Initial Impression / Assessment and Plan / UC Course  I have reviewed the triage vital signs and the nursing notes.  Pertinent labs & imaging results that were available during my care of the patient were reviewed by me and considered in my medical decision making (see chart for details).     Urinalysis showing signs of urinary tract infection.  Urine culture is pending.  Patient also has dental infection.  Will treat with  cephalexin antibiotic as patient has taken this safely without a reaction before and for the reason that this should treat both urinary tract infection and dental infection.  Patient was advised to follow-up with urologist and dentist as soon as possible for the management of these 2 acute problems.  No red flags on exam.  No signs of systemic infection.Discussed strict return precautions. Patient verbalized understanding and is agreeable with plan.  Final Clinical Impressions(s) / UC Diagnoses   Final diagnoses:  Dental abscess  Acute cystitis without hematuria  Dysuria     Discharge Instructions      You have been prescribed antibiotic to treat both urinary tract infection and tooth infection.  Lidocaine has also been prescribed to help alleviate pain.  You may also take ibuprofen.  Urine culture is pending.  We will call if it is positive.  Please follow-up with a dentist and urologist soon as possible.     ED Prescriptions     Medication Sig Dispense Auth. Provider   lidocaine (XYLOCAINE) 2 % solution Use as directed 15 mLs in the mouth or throat as needed for mouth pain. 100 mL Lincoln Kleiner, Rolly Salter E, [place]   cephALEXin (KEFLEX) 500 MG capsule Take 1 capsule (500 mg total) by mouth 4 (four) times daily. 28 capsule [place], Acie Fredrickson, [place]      PDMP not  reviewed this encounter.   Gustavus Bryant, [place] 08/26/21 [phone]

## 2021-08-26 NOTE — ED Triage Notes (Signed)
Onset yesterday of bottom left sided jaw pain. Has been taking ibuprofen and tylenol with some temporary relief.   3-4 days h/o dysuria. Has been taking Azo without a decrease in sxs.

## 2021-08-26 NOTE — Discharge Instructions (Signed)
You have been prescribed antibiotic to treat both urinary tract infection and tooth infection.  Lidocaine has also been prescribed to help alleviate pain.  You may also take ibuprofen.  Urine culture is pending.  We will call if it is positive.  Please follow-up with a dentist and urologist soon as possible.

## 2021-08-29 LAB — URINE CULTURE: Culture: NO GROWTH

## 2021-09-04 DIAGNOSIS — Z419 Encounter for procedure for purposes other than remedying health state, unspecified: Secondary | ICD-10-CM | POA: Diagnosis not present

## 2021-09-28 IMAGING — US US RENAL
1 series · 15 of 25 positions shown · non-contrast
Comparison: None.

CLINICAL DATA: History of nephrolithiasis.

EXAM:
RENAL / URINARY TRACT ULTRASOUND COMPLETE

[Series 1: us renal mc & wl · 15 of 37 slices shown]
[im 1/37]
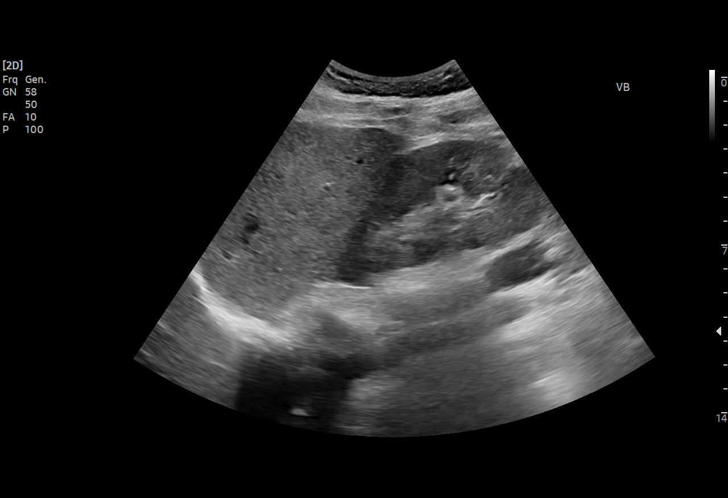
[im 4/37]
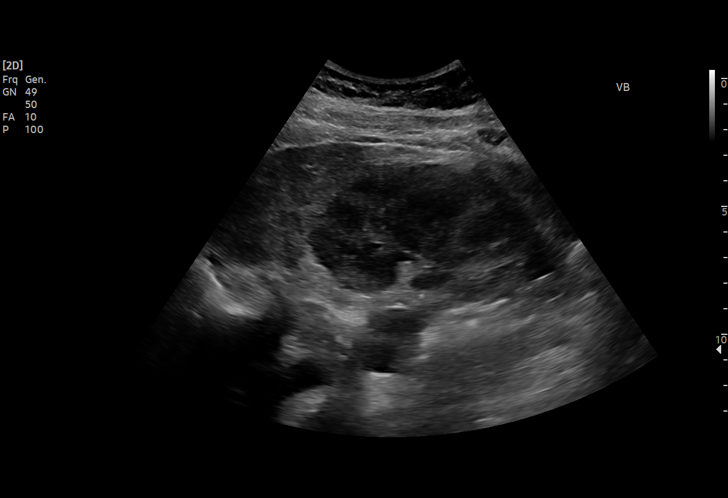
[im 7/37]
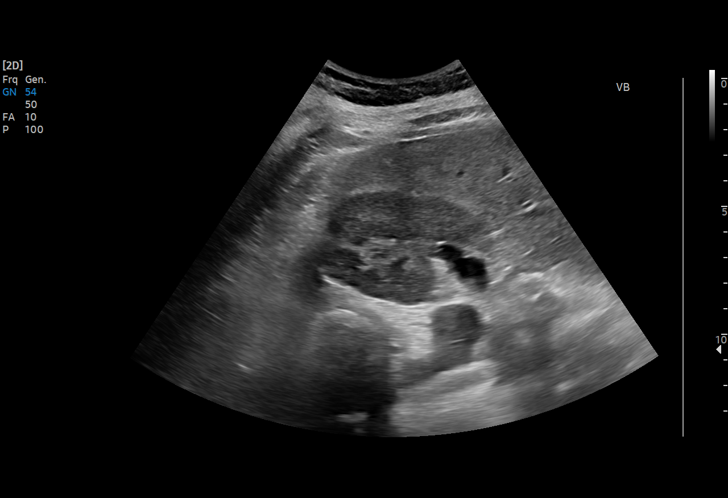
[im 8/37]
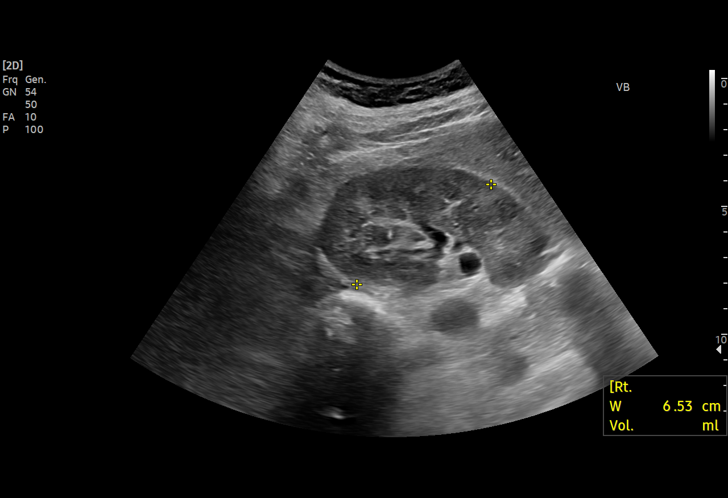
[im 11/37]
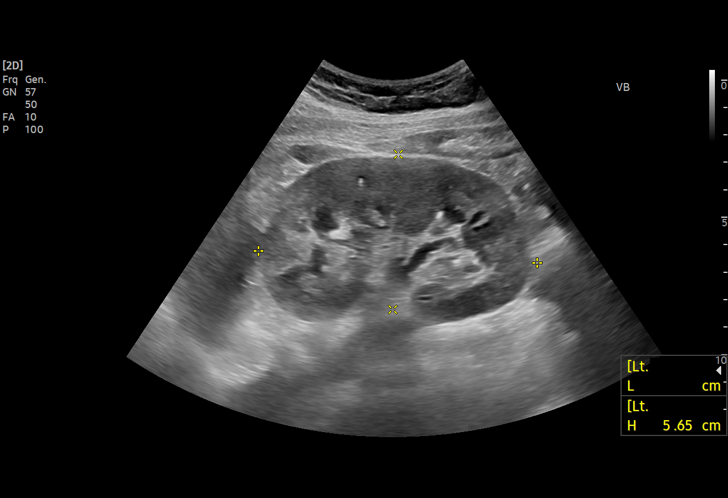
[im 14/37]
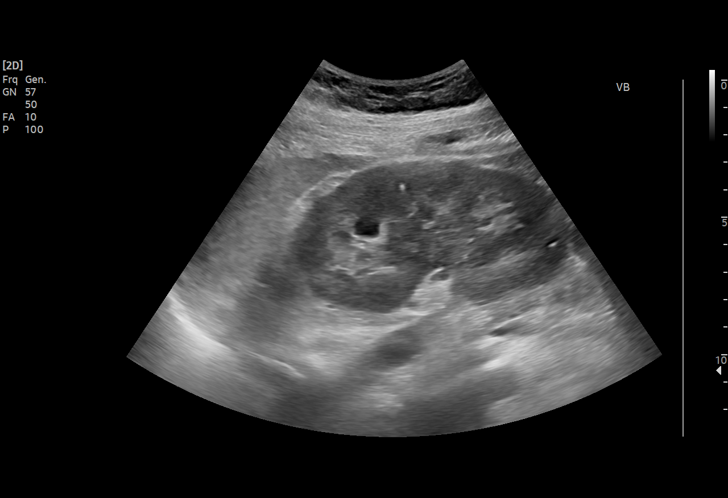
[im 16/37]
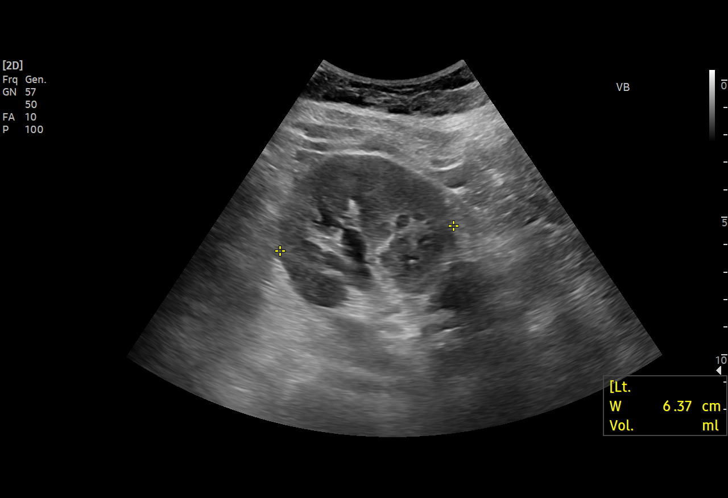
[im 19/37]
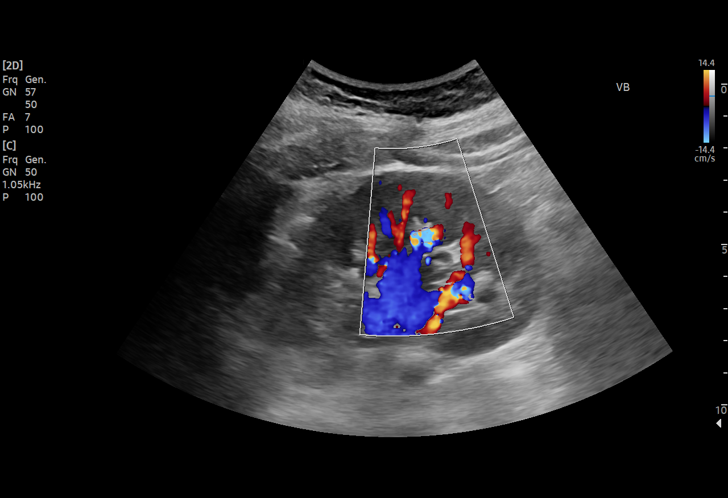
[im 22/37]
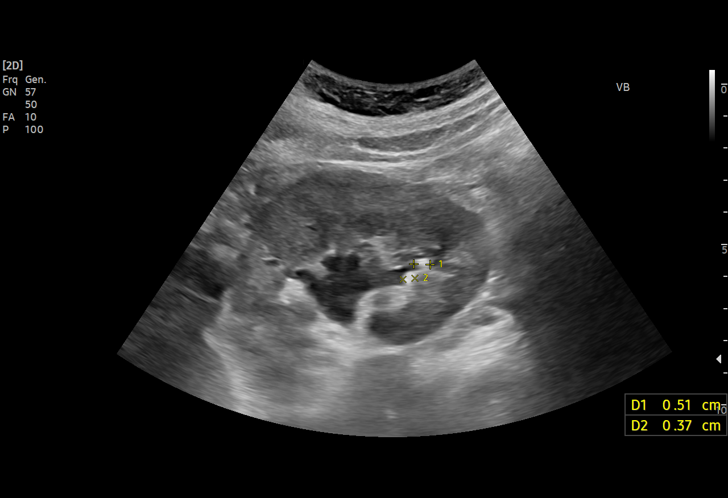
[im 23/37]
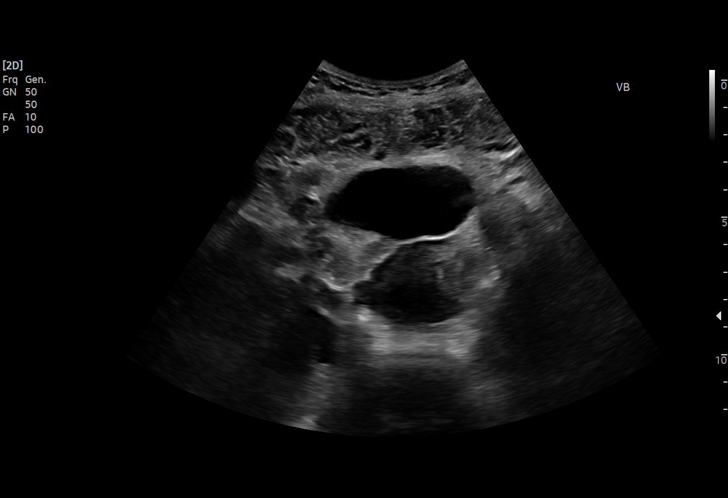
[im 26/37]
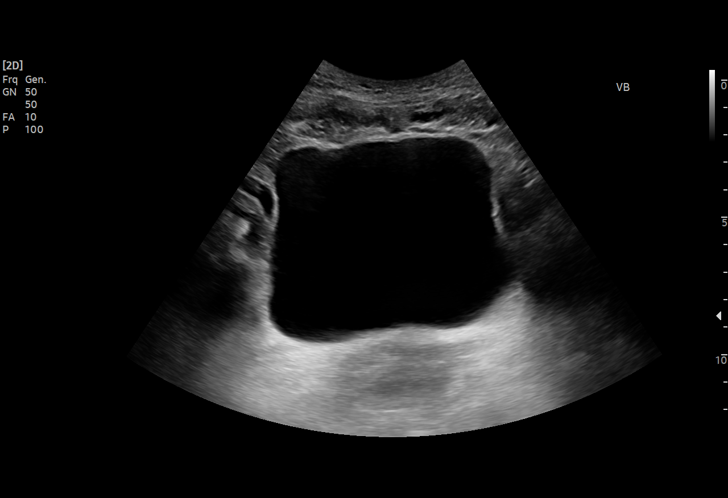
[im 29/37]
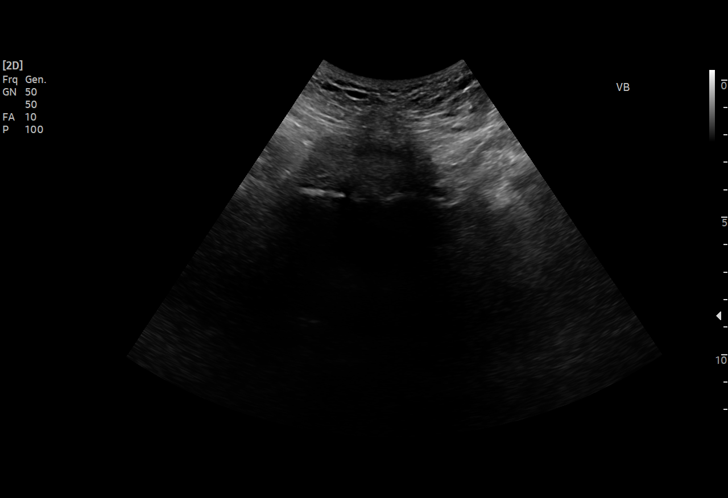
[im 31/37]
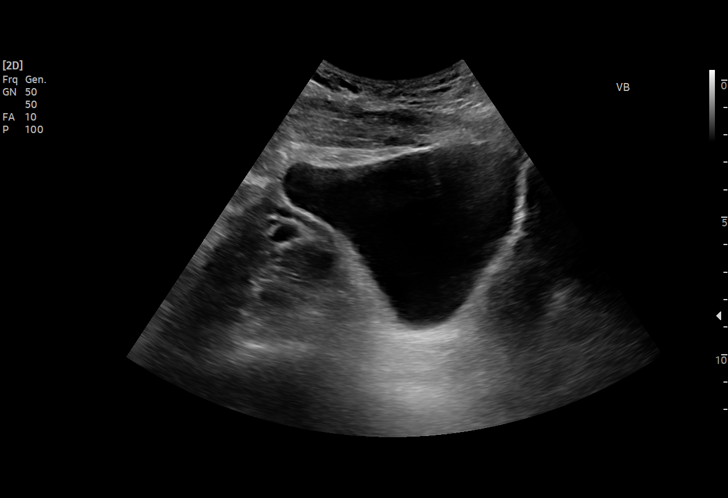
[im 34/37]
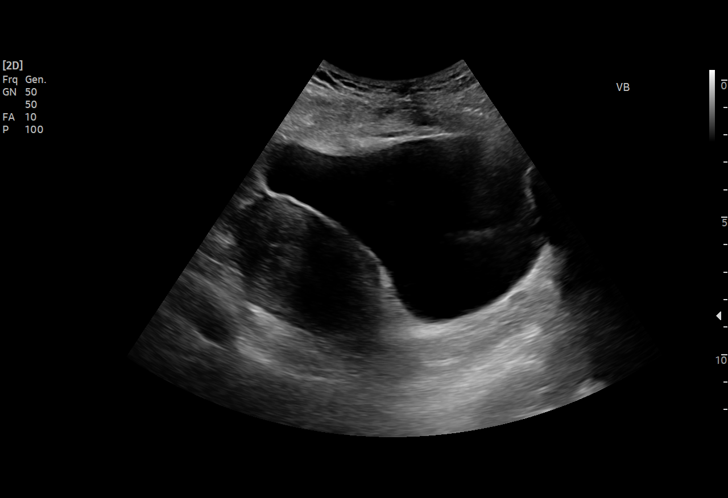
[im 37/37]
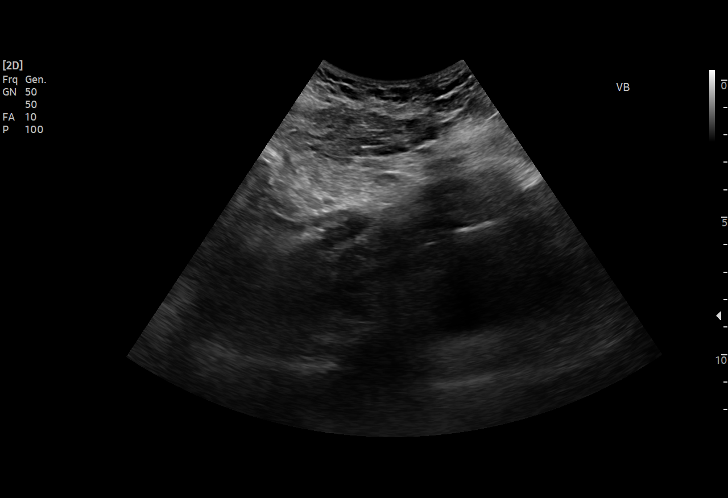

[15 of 25 positions shown; findings below may reference images not displayed]

FINDINGS: Right Kidney:

Renal measurements: 10.4 x 5.9 x 6.5 cm = volume: 209 mL.
Echogenicity within normal limits. No mass or hydronephrosis
visualized.

Left Kidney:

Renal measurements: 10.1 x 5.7 x 6.4 cm = volume: 191 mL. Multiple
shadowing stones identified with the largest measuring 9.9 mm. Mild
hydronephrosis.

Bladder:

Appears normal for degree of bladder distention.

Other:

None.
IMPRESSION: 1. Multiple stones seen in the left kidney with the largest
measuring 9.9 mm.
2. Mild hydronephrosis on the left new compared to November 01, 2020
but otherwise age indeterminate. Recommend clinical correlation. If
there is concern for acute obstruction, a CT scan could better
evaluate.
3. No abnormalities in the right kidney.

## 2021-10-04 DIAGNOSIS — Z419 Encounter for procedure for purposes other than remedying health state, unspecified: Secondary | ICD-10-CM | POA: Diagnosis not present

## 2021-10-05 ENCOUNTER — Other Ambulatory Visit: Payer: Self-pay

## 2021-10-05 ENCOUNTER — Ambulatory Visit
Admission: EM | Admit: 2021-10-05 | Discharge: 2021-10-05 | Disposition: A | Discharge status: Home / Self Care | Payer: Medicaid Other | Attending: Internal Medicine | Admitting: Internal Medicine

## 2021-10-05 DIAGNOSIS — N760 Acute vaginitis: Secondary | ICD-10-CM

## 2021-10-05 DIAGNOSIS — R300 Dysuria: Secondary | ICD-10-CM

## 2021-10-05 DIAGNOSIS — Z113 Encounter for screening for infections with a predominantly sexual mode of transmission: Secondary | ICD-10-CM

## 2021-10-05 LAB — POCT URINALYSIS DIP (MANUAL ENTRY)
Bilirubin, UA: NEGATIVE
Blood, UA: NEGATIVE
Glucose, UA: NEGATIVE mg/dL
Ketones, POC UA: NEGATIVE mg/dL
Nitrite, UA: NEGATIVE
Protein Ur, POC: NEGATIVE mg/dL
Spec Grav, UA: 1.015 (ref 1.010–1.025)
Urobilinogen, UA: 0.2 E.U./dL
pH, UA: 7 (ref 5.0–8.0)

## 2021-10-05 MED ORDER — SULFAMETHOXAZOLE-TRIMETHOPRIM 800-160 MG PO TABS: 1.0000 | ORAL_TABLET | Freq: Two times a day (BID) | ORAL | 0 refills | Status: DC

## 2021-10-05 NOTE — ED Triage Notes (Signed)
One week h/o dysuria and onset yesterday of low back and abdominal pain. Pt also reports that yesterday she noticed an ingrown her in her vaginal area. Pt does shave. She also requests STD testing. Denies urinary frequency and hematuria. No vaginal discharge but notes one episode of vaginal odor.

## 2021-10-05 NOTE — Discharge Instructions (Signed)
You have been prescribed an antibiotic to treat for skin infection as well as possible urinary tract infection.  Your vaginal swab is pending.  We will call if it is positive.  Please seek care at gynecologist or hospital if abscess to vagina persists.

## 2021-10-05 NOTE — ED Provider Notes (Signed)
[name]    CSN: [id number] Arrival date & time: 10/05/21  1756      History   Chief Complaint Chief Complaint  Patient presents with   Abscess    vaginal   Dysuria    HPI Theresa Shepherd is a 35 y.o. female.   Patient presents with 1 week history of urinary burning and an onset yesterday of low back pain and lower abdominal pain.  Denies any fever, urinary frequency, vaginal discharge, hematuria, pelvic pain.  Denies any known exposure to STD but is requesting STD testing.  She also reports that she had issues with kidney stones and had a urological procedure a few months prior, and reports that she has had issues with urinary burning ever since.  Was seen a few months prior and was prescribed cephalexin antibiotic for suspicion of urinary tract infection given similar symptoms.  Patient also reports that she has an abscess to her vaginal labia that has been present for a few days.  Denies any drainage from the abscess.  Denies any fevers.   Abscess Dysuria  Past Medical History:  Diagnosis Date   Asthma    as child   Eczema    Gestational diabetes    Infection    UTI   Kidney stones    UTI (urinary tract infection)     Patient Active Problem List   Diagnosis Date Noted   Encounter for maternal care for low transverse scar from repeat cesarean delivery 02/06/2021   S/P repeat low transverse C-section 02/06/2021   History of cesarean delivery x 1--2012, FTP 02/04/2021   History of COVID-19--12/2020 02/04/2021   History of urinary calculi--2021 02/04/2021   Gestational diabetes mellitus (GDM), antepartum 12/11/2020   GBS bacteriuria 09/19/2020    Past Surgical History:  Procedure Laterality Date   CESAREAN SECTION     CESAREAN SECTION N/A 02/06/2021   Procedure: CESAREAN SECTION;  Surgeon: Jaymes Graff, MD;  Location: [facility];  Service: Obstetrics;  Laterality: N/A;   CYSTOSCOPY WITH RETROGRADE PYELOGRAM, URETEROSCOPY AND STENT PLACEMENT Left  08/02/2021   Procedure: CYSTOSCOPY WITH RETROGRADE PYELOGRAM, URETEROSCOPY AND STENT PLACEMENT;  Surgeon: Malen Gauze, MD;  Location: [facility];  Service: Urology;  Laterality: Left;   INDUCED ABORTION      OB History     Gravida  3   Para  2   Term  2   Preterm      AB  1   Living  2      SAB      IAB  1   Ectopic      Multiple  0   Live Births  2            Home Medications    Prior to Admission medications   Medication Sig Start Date End Date Taking? Authorizing Provider  sulfamethoxazole-trimethoprim (BACTRIM DS) 800-160 MG tablet Take 1 tablet by mouth 2 (two) times daily for 7 days. 10/05/21 10/12/21 Yes Michaelangelo Mittelman, Acie Fredrickson, FNP  cephALEXin (KEFLEX) 500 MG capsule Take 1 capsule (500 mg total) by mouth 4 (four) times daily. 08/26/21   Gustavus Bryant, FNP  diphenhydramine-acetaminophen (TYLENOL PM) 25-500 MG TABS tablet Take 1 tablet by mouth at bedtime as needed (sleep/pain).    [provider]  ibuprofen (ADVIL) 200 MG tablet Take 600-800 mg by mouth every 6 (six) hours as needed for moderate pain or headache.    [provider]  lidocaine (XYLOCAINE) 2 % solution  Use as directed 15 mLs in the mouth or throat as needed for mouth pain. 08/26/21   Teodora Medici, FNP  ondansetron (ZOFRAN) 4 MG tablet Take 1 tablet (4 mg total) by mouth daily as needed for nausea or vomiting. 08/02/21 08/02/22  McKenzie, Candee Furbish, MD  oxycodone (OXY-IR) 5 MG capsule Take 1 capsule (5 mg total) by mouth every 4 (four) hours as needed. 08/03/21   McKenzie, Candee Furbish, MD  oxyCODONE-acetaminophen (PERCOCET) 5-325 MG tablet Take 1 tablet by mouth every 4 (four) hours as needed for severe pain. 08/02/21 08/02/22  Cleon Gustin, MD  triamcinolone cream (KENALOG) 0.1 % Apply 1 application topically 2 (two) times daily. Patient taking differently: Apply 1 application topically 2 (two) times daily as needed (dry skin). 04/12/21   Volney American, PA-C    Family  History Family History  Problem Relation Age of Onset   Diabetes Maternal Grandmother    Heart disease Maternal Grandmother    Asthma Other    Hypertension Other    Diabetes Other    Heart disease Paternal Grandfather    Diabetes Mother    Hypertension Mother    COPD Mother    Hypertension Father    Stomach cancer Paternal Grandmother     Social History Social History   Tobacco Use   Smoking status: Never   Smokeless tobacco: Never  Vaping Use   Vaping Use: Never used  Substance Use Topics   Alcohol use: Not Currently    Alcohol/week: 3.0 standard drinks    Types: 3 Standard drinks or equivalent per week    Comment: 2-3x/month   Drug use: Not Currently    Types: Marijuana    Comment: last use 10/17/19     Allergies   Augmentin [amoxicillin-pot clavulanate]   Review of Systems Review of Systems Per HPI  Physical Exam Triage Vital Signs ED Triage Vitals  Enc Vitals Group     BP 10/05/21 1816 119/61     Pulse Rate 10/05/21 1816 75     Resp 10/05/21 1816 18     Temp 10/05/21 1816 97.6 F (36.4 C)     Temp Source 10/05/21 1816 Oral     SpO2 10/05/21 1816 97 %     Weight --      Height --      Head Circumference --      Peak Flow --      Pain Score 10/05/21 1819 4     Pain Loc --      Pain Edu? --      Excl. in [place]? --    No data found.  Updated Vital Signs BP 119/61 (BP Location: Left Arm)   Pulse 75   Temp 97.6 F (36.4 C) (Oral)   Resp 18   LMP 09/26/2021 (Exact Date)   SpO2 97%   Breastfeeding No   Visual Acuity Right Eye Distance:   Left Eye Distance:   Bilateral Distance:    Right Eye Near:   Left Eye Near:    Bilateral Near:     Physical Exam Exam conducted with a chaperone present.  Constitutional:      General: She is not in acute distress.    Appearance: Normal appearance. She is not toxic-appearing or diaphoretic.  HENT:     Head: Normocephalic and atraumatic.  Eyes:     Extraocular Movements: Extraocular movements  intact.     Conjunctiva/sclera: Conjunctivae normal.     Pupils: Pupils are equal,  round, and reactive to light.  Cardiovascular:     Rate and Rhythm: Normal rate and regular rhythm.     Pulses: Normal pulses.     Heart sounds: Normal heart sounds.  Pulmonary:     Effort: Pulmonary effort is normal.     Breath sounds: Normal breath sounds.  Abdominal:     General: Bowel sounds are normal. There is no distension.     Palpations: Abdomen is soft.     Tenderness: There is no abdominal tenderness.  Genitourinary:    Labia:        Right: Tenderness and lesion present. No rash or injury.        Left: No rash, tenderness, lesion or injury.        Comments: Patient has indurated abscess to right vaginal labia that is approximately 1 cm in diameter.  No drainage noted at this time. Neurological:     General: No focal deficit present.     Mental Status: She is alert and oriented to person, place, and time. Mental status is at baseline.  Psychiatric:        Mood and Affect: Mood normal.        Behavior: Behavior normal.        Thought Content: Thought content normal.        Judgment: Judgment normal.     UC Treatments / Results  Labs (all labs ordered are listed, but only abnormal results are displayed) Labs Reviewed  POCT URINALYSIS DIP (MANUAL ENTRY) - Abnormal; Notable for the following components:      Result Value   Leukocytes, UA Trace (*)    All other components within normal limits  URINE CULTURE  CERVICOVAGINAL ANCILLARY ONLY    EKG   Radiology No results found.  Procedures Procedures (including critical care time)  Medications Ordered in UC Medications - No data to display  Initial Impression / Assessment and Plan / UC Course  I have reviewed the triage vital signs and the nursing notes.  Pertinent labs & imaging results that were available during my care of the patient were reviewed by me and considered in my medical decision making (see chart for  details).     Trace leukocytes were found on urinalysis.  This is not definitive for urinary tract infection.  Urine culture is pending.  Patient has skin infection of the vaginal labia.  Will treat with Bactrim as this will cover for urinary tract infection as well as skin infection as patient recently received antibiotic treatment with cephalexin.  Vaginal swab pending to rule out STD because of urinary burning as well as BV or yeast.  Will change treatment if needed once test results are complete. Lesion does not seem consistent with Bartholin cyst.  Advised patient that she will need to use warm compresses and if no improvement she will need to follow-up with gynecology or surgery for further evaluation and management.  No I&D drainage warranted at this time as abscess is indurated and would not benefit from this.  No red flags seen on exam.  Discussed strict return precautions.  Patient verbalized understanding and was agreeable with plan.  Patient may also need to see urology if symptoms of dysuria persist. Final Clinical Impressions(s) / UC Diagnoses   Final diagnoses:  Dysuria  Abscess of vagina  Screening examination for venereal disease     Discharge Instructions      You have been prescribed an antibiotic to treat for skin infection as well  as possible urinary tract infection.  Your vaginal swab is pending.  We will call if it is positive.  Please seek care at gynecologist or hospital if abscess to vagina persists.    ED Prescriptions     Medication Sig Dispense Auth. Provider   sulfamethoxazole-trimethoprim (BACTRIM DS) 800-160 MG tablet Take 1 tablet by mouth 2 (two) times daily for 7 days. 14 tablet [place], Michele Rockers, [place]      PDMP not reviewed this encounter.   Teodora Medici, [place] 10/05/21 [phone]

## 2021-10-07 LAB — URINE CULTURE

## 2021-10-08 LAB — CERVICOVAGINAL ANCILLARY ONLY
Bacterial Vaginitis (gardnerella): POSITIVE — AB
Candida Glabrata: NEGATIVE
Candida Vaginitis: NEGATIVE
Chlamydia: NEGATIVE
Comment: NEGATIVE
Comment: NEGATIVE
Comment: NEGATIVE
Comment: NEGATIVE
Comment: NEGATIVE
Comment: NORMAL
Neisseria Gonorrhea: NEGATIVE
Trichomonas: NEGATIVE

## 2021-10-09 ENCOUNTER — Telehealth (HOSPITAL_COMMUNITY): Payer: Self-pay | Admitting: Emergency Medicine

## 2021-10-09 MED ORDER — METRONIDAZOLE 500 MG PO TABS: 500.0000 mg | ORAL_TABLET | Freq: Two times a day (BID) | ORAL | 0 refills | Status: DC

## 2021-10-24 DIAGNOSIS — Z124 Encounter for screening for malignant neoplasm of cervix: Secondary | ICD-10-CM | POA: Diagnosis not present

## 2021-10-24 DIAGNOSIS — Z309 Encounter for contraceptive management, unspecified: Secondary | ICD-10-CM | POA: Diagnosis not present

## 2021-10-24 DIAGNOSIS — R102 Pelvic and perineal pain: Secondary | ICD-10-CM | POA: Diagnosis not present

## 2021-10-24 DIAGNOSIS — Z113 Encounter for screening for infections with a predominantly sexual mode of transmission: Secondary | ICD-10-CM | POA: Diagnosis not present

## 2021-11-04 DIAGNOSIS — Z419 Encounter for procedure for purposes other than remedying health state, unspecified: Secondary | ICD-10-CM | POA: Diagnosis not present

## 2021-11-08 DIAGNOSIS — N925 Other specified irregular menstruation: Secondary | ICD-10-CM | POA: Diagnosis not present

## 2021-11-08 DIAGNOSIS — F5222 Female sexual arousal disorder: Secondary | ICD-10-CM | POA: Diagnosis not present

## 2021-11-08 DIAGNOSIS — R300 Dysuria: Secondary | ICD-10-CM | POA: Diagnosis not present

## 2021-11-08 DIAGNOSIS — N760 Acute vaginitis: Secondary | ICD-10-CM | POA: Diagnosis not present

## 2021-12-05 DIAGNOSIS — Z419 Encounter for procedure for purposes other than remedying health state, unspecified: Secondary | ICD-10-CM | POA: Diagnosis not present

## 2021-12-11 ENCOUNTER — Ambulatory Visit
Admission: EM | Admit: 2021-12-11 | Discharge: 2021-12-11 | Disposition: A | Discharge status: Home / Self Care | Payer: Medicaid Other | Attending: Internal Medicine | Admitting: Internal Medicine

## 2021-12-11 ENCOUNTER — Encounter: Payer: Self-pay | Admitting: Emergency Medicine

## 2021-12-11 ENCOUNTER — Other Ambulatory Visit: Payer: Self-pay

## 2021-12-11 DIAGNOSIS — N3001 Acute cystitis with hematuria: Secondary | ICD-10-CM | POA: Diagnosis not present

## 2021-12-11 DIAGNOSIS — R350 Frequency of micturition: Secondary | ICD-10-CM | POA: Diagnosis not present

## 2021-12-11 DIAGNOSIS — R300 Dysuria: Secondary | ICD-10-CM | POA: Diagnosis not present

## 2021-12-11 DIAGNOSIS — Z113 Encounter for screening for infections with a predominantly sexual mode of transmission: Secondary | ICD-10-CM

## 2021-12-11 LAB — POCT URINALYSIS DIP (MANUAL ENTRY)
Glucose, UA: 500 mg/dL — AB
Nitrite, UA: POSITIVE — AB
Protein Ur, POC: >=300 mg/dL — AB
Spec Grav, UA: 1.015 (ref 1.010–1.025)
Urobilinogen, UA: >=8 E.U./dL — AB
pH, UA: 6 (ref 5.0–8.0)

## 2021-12-11 LAB — POCT URINE PREGNANCY: Preg Test, Ur: NEGATIVE

## 2021-12-11 MED ORDER — NITROFURANTOIN MONOHYD MACRO 100 MG PO CAPS: 100.0000 mg | ORAL_CAPSULE | Freq: Two times a day (BID) | ORAL | 0 refills | Status: DC

## 2021-12-11 NOTE — ED Triage Notes (Signed)
Pt sts dysuria and pain in bladder area x 5 days; pt sts taking azo without relief; pt also requests a swab

## 2021-12-11 NOTE — ED Provider Notes (Signed)
[name]    CSN: [id number] Arrival date & time: 12/11/21  1202      History   Chief Complaint Chief Complaint  Patient presents with   Dysuria    HPI Theresa Shepherd is a 36 y.o. female.   Patient presents with urinary burning, urinary frequency, and urinary urgency that has been present for approximately 5 days.  Patient has been taking Azo with minimal improvement in symptoms.  Denies vaginal discharge, pelvic pain, abdominal pain, fever, back pain, hematuria.  She is requesting a vaginal swab to rule out vaginal infection as well.  Denies any known exposure to STD.   Dysuria  Past Medical History:  Diagnosis Date   Asthma    as child   Eczema    Gestational diabetes    Infection    UTI   Kidney stones    UTI (urinary tract infection)     Patient Active Problem List   Diagnosis Date Noted   Encounter for maternal care for low transverse scar from repeat cesarean delivery 02/06/2021   S/P repeat low transverse C-section 02/06/2021   History of cesarean delivery x 1--2012, FTP 02/04/2021   History of COVID-19--12/2020 02/04/2021   History of urinary calculi--2021 02/04/2021   Gestational diabetes mellitus (GDM), antepartum 12/11/2020   GBS bacteriuria 09/19/2020    Past Surgical History:  Procedure Laterality Date   CESAREAN SECTION     CESAREAN SECTION N/A 02/06/2021   Procedure: CESAREAN SECTION;  Surgeon: Crawford Givens, MD;  Location: [facility];  Service: Obstetrics;  Laterality: N/A;   CYSTOSCOPY WITH RETROGRADE PYELOGRAM, URETEROSCOPY AND STENT PLACEMENT Left 08/02/2021   Procedure: CYSTOSCOPY WITH RETROGRADE PYELOGRAM, URETEROSCOPY AND STENT PLACEMENT;  Surgeon: Cleon Gustin, MD;  Location: [facility];  Service: Urology;  Laterality: Left;   INDUCED ABORTION      OB History     Gravida  3   Para  2   Term  2   Preterm      AB  1   Living  2      SAB      IAB  1   Ectopic      Multiple  0   Live Births  2             Home Medications    Prior to Admission medications   Medication Sig Start Date End Date Taking? Authorizing Provider  nitrofurantoin, macrocrystal-monohydrate, (MACROBID) 100 MG capsule Take 1 capsule (100 mg total) by mouth 2 (two) times daily. 12/11/21  Yes Avel Ogawa, Hildred Alamin E, FNP  cephALEXin (KEFLEX) 500 MG capsule Take 1 capsule (500 mg total) by mouth 4 (four) times daily. Patient not taking: Reported on 12/11/2021 08/26/21   Teodora Medici, FNP  diphenhydramine-acetaminophen (TYLENOL PM) 25-500 MG TABS tablet Take 1 tablet by mouth at bedtime as needed (sleep/pain).    [provider]  ibuprofen (ADVIL) 200 MG tablet Take 600-800 mg by mouth every 6 (six) hours as needed for moderate pain or headache.    [provider]  lidocaine (XYLOCAINE) 2 % solution Use as directed 15 mLs in the mouth or throat as needed for mouth pain. 08/26/21   Teodora Medici, FNP  metroNIDAZOLE (FLAGYL) 500 MG tablet Take 1 tablet (500 mg total) by mouth 2 (two) times daily. Patient not taking: Reported on 12/11/2021 10/09/21   Chase Picket, MD  ondansetron (ZOFRAN) 4 MG tablet Take 1 tablet (4 mg total) by mouth daily as  needed for nausea or vomiting. 08/02/21 08/02/22  McKenzie, Candee Furbish, MD  oxycodone (OXY-IR) 5 MG capsule Take 1 capsule (5 mg total) by mouth every 4 (four) hours as needed. 08/03/21   McKenzie, Candee Furbish, MD  oxyCODONE-acetaminophen (PERCOCET) 5-325 MG tablet Take 1 tablet by mouth every 4 (four) hours as needed for severe pain. 08/02/21 08/02/22  Cleon Gustin, MD  triamcinolone cream (KENALOG) 0.1 % Apply 1 application topically 2 (two) times daily. Patient taking differently: Apply 1 application topically 2 (two) times daily as needed (dry skin). 04/12/21   Volney American, PA-C    Family History Family History  Problem Relation Age of Onset   Diabetes Maternal Grandmother    Heart disease Maternal Grandmother    Asthma Other    Hypertension Other    Diabetes  Other    Heart disease Paternal Grandfather    Diabetes Mother    Hypertension Mother    COPD Mother    Hypertension Father    Stomach cancer Paternal Grandmother     Social History Social History   Tobacco Use   Smoking status: Never   Smokeless tobacco: Never  Vaping Use   Vaping Use: Never used  Substance Use Topics   Alcohol use: Not Currently    Alcohol/week: 3.0 standard drinks    Types: 3 Standard drinks or equivalent per week    Comment: 2-3x/month   Drug use: Not Currently    Types: Marijuana    Comment: last use 10/17/19     Allergies   Augmentin [amoxicillin-pot clavulanate]   Review of Systems Review of Systems Per HPI  Physical Exam Triage Vital Signs ED Triage Vitals [12/11/21 1340]  Enc Vitals Group     BP 122/67     Pulse Rate 79     Resp 18     Temp 98 F (36.7 C)     Temp Source Oral     SpO2 95 %     Weight      Height      Head Circumference      Peak Flow      Pain Score 5     Pain Loc      Pain Edu?      Excl. in [place]?    No data found.  Updated Vital Signs BP 122/67 (BP Location: Left Arm)    Pulse 79    Temp 98 F (36.7 C) (Oral)    Resp 18    SpO2 95%   Visual Acuity Right Eye Distance:   Left Eye Distance:   Bilateral Distance:    Right Eye Near:   Left Eye Near:    Bilateral Near:     Physical Exam Constitutional:      General: She is not in acute distress.    Appearance: Normal appearance. She is not toxic-appearing or diaphoretic.  HENT:     Head: Normocephalic and atraumatic.  Eyes:     Extraocular Movements: Extraocular movements intact.     Conjunctiva/sclera: Conjunctivae normal.  Cardiovascular:     Rate and Rhythm: Normal rate and regular rhythm.     Pulses: Normal pulses.     Heart sounds: Normal heart sounds.  Pulmonary:     Effort: Pulmonary effort is normal. No respiratory distress.     Breath sounds: Normal breath sounds.  Abdominal:     General: Abdomen is flat. Bowel sounds are normal.  There is no distension.     Palpations: Abdomen is  soft.     Tenderness: There is no abdominal tenderness.  Genitourinary:    Comments: Deferred with shared decision making.  Self swab performed. Neurological:     General: No focal deficit present.     Mental Status: She is alert and oriented to person, place, and time. Mental status is at baseline.  Psychiatric:        Mood and Affect: Mood normal.        Behavior: Behavior normal.        Thought Content: Thought content normal.        Judgment: Judgment normal.     UC Treatments / Results  Labs (all labs ordered are listed, but only abnormal results are displayed) Labs Reviewed  POCT URINALYSIS DIP (MANUAL ENTRY) - Abnormal; Notable for the following components:      Result Value   Color, UA orange (*)    Glucose, UA =500 (*)    Bilirubin, UA small (*)    Ketones, POC UA small (15) (*)    Blood, UA small (*)    Protein Ur, POC >=300 (*)    Urobilinogen, UA >=8.0 (*)    Nitrite, UA Positive (*)    Leukocytes, UA Large (3+) (*)    All other components within normal limits  URINE CULTURE  POCT URINE PREGNANCY  CERVICOVAGINAL ANCILLARY ONLY    EKG   Radiology No results found.  Procedures Procedures (including critical care time)  Medications Ordered in UC Medications - No data to display  Initial Impression / Assessment and Plan / UC Course  I have reviewed the triage vital signs and the nursing notes.  Pertinent labs & imaging results that were available during my care of the patient were reviewed by me and considered in my medical decision making (see chart for details).     Urinalysis indicating urinary tract infection.  Will treat with Macrobid antibiotic.  Cervicovaginal swab pending per patient request.  Urine culture pending as well.  Discussed return precautions.  Patient verbalized understanding and was agreeable with plan. Final Clinical Impressions(s) / UC Diagnoses   Final diagnoses:  Acute  cystitis with hematuria  Dysuria  Urinary frequency  Screening examination for venereal disease     Discharge Instructions      You have a urinary tract infection which is being treated with Macrobid antibiotic.  Urine culture and vaginal swab are pending.  We will call results and treat as appropriate.  Please refrain from sexual activity until test results and treatment are complete.    ED Prescriptions     Medication Sig Dispense Auth. Provider   nitrofurantoin, macrocrystal-monohydrate, (MACROBID) 100 MG capsule Take 1 capsule (100 mg total) by mouth 2 (two) times daily. 10 capsule Teodora Medici, [place]      PDMP not reviewed this encounter.   Teodora Medici, [place] 12/11/21 1420

## 2021-12-11 NOTE — Discharge Instructions (Signed)
You have a urinary tract infection which is being treated with Macrobid antibiotic.  Urine culture and vaginal swab are pending.  We will call results and treat as appropriate.  Please refrain from sexual activity until test results and treatment are complete.

## 2021-12-12 ENCOUNTER — Ambulatory Visit (INDEPENDENT_AMBULATORY_CARE_PROVIDER_SITE_OTHER): Payer: Medicaid Other | Admitting: Physician Assistant

## 2021-12-12 VITALS — BP 121/77 | HR 78 | Wt 160.0 lb

## 2021-12-12 DIAGNOSIS — R350 Frequency of micturition: Secondary | ICD-10-CM

## 2021-12-12 DIAGNOSIS — N3000 Acute cystitis without hematuria: Secondary | ICD-10-CM | POA: Diagnosis not present

## 2021-12-12 DIAGNOSIS — R1012 Left upper quadrant pain: Secondary | ICD-10-CM | POA: Diagnosis not present

## 2021-12-12 DIAGNOSIS — N200 Calculus of kidney: Secondary | ICD-10-CM | POA: Diagnosis not present

## 2021-12-12 LAB — MICROSCOPIC EXAMINATION
Bacteria, UA: NONE SEEN
Epithelial Cells (non renal): NONE SEEN /hpf (ref 0–10)
Renal Epithel, UA: NONE SEEN /hpf

## 2021-12-12 LAB — URINALYSIS, ROUTINE W REFLEX MICROSCOPIC
Bilirubin, UA: NEGATIVE
Glucose, UA: NEGATIVE
Ketones, UA: NEGATIVE
Leukocytes,UA: NEGATIVE
Nitrite, UA: POSITIVE — AB
Protein,UA: NEGATIVE
Specific Gravity, UA: 1.015 (ref 1.005–1.030)
Urobilinogen, Ur: 0.2 mg/dL (ref 0.2–1.0)
pH, UA: 7 (ref 5.0–7.5)

## 2021-12-12 MED ORDER — PHENAZOPYRIDINE HCL 100 MG PO TABS: 100.0000 mg | ORAL_TABLET | Freq: Three times a day (TID) | ORAL | 0 refills | Status: DC | PRN

## 2021-12-12 NOTE — Progress Notes (Signed)
Assessment: 1. Urine frequency   2. Left upper quadrant abdominal pain   3. Nephrolithiasis   4. Acute cystitis without hematuria     Plan: Continue Macrobid and await cx result. Will obtain CT stone study to evaluate for additional stones/obstruction. Pt will FU with Dr. Alyson Ingles after study to plan further intervention pending results. Pyridium Rx sent for relief of dysuria. Pt will resume Flomax as well.  Chief Complaint: Urinary urgency and frequency  HPI: Theresa Shepherd is a 36 y.o. female who presents for evaluation of urinary frequency and left sided flank pain for almost a week. Pt was seen at [facility] yesterday and given Macrobid which she has started. Culture pending. Pt continues to c/o flank pain and dysuria with little relief on AZO. NO fever, chills, NV. No gross hematuria. Pt has h/o renal stones on the left with stone extraction last August. Last [place] prior to procedure indicating several stones on the left, largest being 9.56mm. The pt pulled her own stent and has not had imaging since. Medical records personally reviewed during OV, including imaging studies and labs.   Portions of the above documentation were copied from a prior visit for review purposes only.  Allergies: Allergies  Allergen Reactions   Augmentin [Amoxicillin-Pot Clavulanate] Hives    PMH: Past Medical History:  Diagnosis Date   Asthma    as child   Eczema    Gestational diabetes    Infection    UTI   Kidney stones    UTI (urinary tract infection)     PSH: Past Surgical History:  Procedure Laterality Date   CESAREAN SECTION     CESAREAN SECTION N/A 02/06/2021   Procedure: CESAREAN SECTION;  Surgeon: Crawford Givens, MD;  Location: [facility];  Service: Obstetrics;  Laterality: N/A;   CYSTOSCOPY WITH RETROGRADE PYELOGRAM, URETEROSCOPY AND STENT PLACEMENT Left 08/02/2021   Procedure: CYSTOSCOPY WITH RETROGRADE PYELOGRAM, URETEROSCOPY AND STENT PLACEMENT;  Surgeon: Cleon Gustin, MD;   Location: [facility];  Service: Urology;  Laterality: Left;   INDUCED ABORTION      SH: Social History   Tobacco Use   Smoking status: Never   Smokeless tobacco: Never  Vaping Use   Vaping Use: Never used  Substance Use Topics   Alcohol use: Not Currently    Alcohol/week: 3.0 standard drinks    Types: 3 Standard drinks or equivalent per week    Comment: 2-3x/month   Drug use: Not Currently    Types: Marijuana    Comment: last use 10/17/19    ROS: Constitutional:  Negative for fever, chills, weight loss CV: Negative for chest pain, previous MI, hypertension Respiratory:  Negative for shortness of breath, wheezing, sleep apnea, frequent cough GI:  Negative for nausea, vomiting, bloody stool, GERD  PE: BP 121/77    Pulse 78    Wt 160 lb (72.6 kg)    BMI 28.34 kg/m  GENERAL APPEARANCE:  Well appearing, well developed, well nourished, NAD HEENT:  Atraumatic, normocephalic NECK:  Supple. Trachea midline ABDOMEN:  Soft, non-tender, no masses EXTREMITIES:  Moves all extremities well, without clubbing, cyanosis, or edema NEUROLOGIC:  Alert and oriented x 3, normal gait, CN II-XII grossly intact MENTAL STATUS:  appropriate BACK:  Non-tender to palpation, Left sided CVAT SKIN:  Warm, dry, and intact   Results: Laboratory Data: Lab Results  Component Value Date   WBC 7.5 07/31/2021   HGB 13.4 07/31/2021   HCT 40.3 07/31/2021   MCV 89.4 07/31/2021  PLT 214 07/31/2021    Lab Results  Component Value Date   CREATININE 0.56 02/05/2021    Urinalysis    Component Value Date/Time   COLORURINE STRAW (A) 01/29/2021 2033   APPEARANCEUR CLEAR 01/29/2021 2033   APPEARANCEUR Clear 10/16/2020 1346   LABSPEC 1.004 (L) 01/29/2021 2033   PHURINE 6.0 01/29/2021 2033   GLUCOSEU NEGATIVE 01/29/2021 2033   HGBUR NEGATIVE 01/29/2021 2033   BILIRUBINUR small (A) 12/11/2021 1355   BILIRUBINUR negative 11/08/2020 1024   BILIRUBINUR Negative 10/16/2020 1346   KETONESUR small (15) (A)  12/11/2021 1355   KETONESUR NEGATIVE 01/29/2021 2033   PROTEINUR >=300 (A) 12/11/2021 1355   PROTEINUR NEGATIVE 01/29/2021 2033   UROBILINOGEN >=8.0 (A) 12/11/2021 1355   UROBILINOGEN 0.2 09/28/2019 1151   NITRITE Positive (A) 12/11/2021 1355   NITRITE NEGATIVE 01/29/2021 2033   LEUKOCYTESUR Large (3+) (A) 12/11/2021 1355   LEUKOCYTESUR NEGATIVE 01/29/2021 2033    Lab Results  Component Value Date   LABMICR See below: 10/16/2020   WBCUA 0-5 10/16/2020   LABEPIT 0-10 10/16/2020   BACTERIA RARE (A) 10/17/2020    Pertinent Imaging:   Results for orders placed during the hospital encounter of 06/29/21  Ultrasound renal complete  Narrative CLINICAL DATA:  History of nephrolithiasis.  EXAM: RENAL / URINARY TRACT ULTRASOUND COMPLETE  COMPARISON:  None.  FINDINGS: Right Kidney:  Renal measurements: 10.4 x 5.9 x 6.5 cm = volume: 209 mL. Echogenicity within normal limits. No mass or hydronephrosis visualized.  Left Kidney:  Renal measurements: 10.1 x 5.7 x 6.4 cm = volume: 191 mL. Multiple shadowing stones identified with the largest measuring 9.9 mm. Mild hydronephrosis.  Bladder:  Appears normal for degree of bladder distention.  Other:  None.  IMPRESSION: 1. Multiple stones seen in the left kidney with the largest measuring 9.9 mm. 2. Mild hydronephrosis on the left new compared to November 01, 2020 but otherwise age indeterminate. Recommend clinical correlation. If there is concern for acute obstruction, a CT scan could better evaluate. 3. No abnormalities in the right kidney.   Electronically Signed By: Dorise Bullion III M.D. On: 06/30/2021 13:07  No results found for this or any previous visit.  No results found for this or any previous visit.  No results found for this or any previous visit.  No results found for this or any previous visit (from the past 24 hour(s)).

## 2021-12-12 NOTE — Progress Notes (Signed)
post void residual = 0 ml

## 2021-12-13 ENCOUNTER — Telehealth (HOSPITAL_COMMUNITY): Payer: Self-pay | Admitting: Emergency Medicine

## 2021-12-13 LAB — CERVICOVAGINAL ANCILLARY ONLY
Bacterial Vaginitis (gardnerella): POSITIVE — AB
Candida Glabrata: POSITIVE — AB
Candida Vaginitis: NEGATIVE
Chlamydia: NEGATIVE
Comment: NEGATIVE
Comment: NEGATIVE
Comment: NEGATIVE
Comment: NEGATIVE
Comment: NEGATIVE
Comment: NORMAL
Neisseria Gonorrhea: NEGATIVE
Trichomonas: NEGATIVE

## 2021-12-13 MED ORDER — METRONIDAZOLE 500 MG PO TABS: 500.0000 mg | ORAL_TABLET | Freq: Two times a day (BID) | ORAL | 0 refills | Status: DC

## 2021-12-13 MED ORDER — FLUCONAZOLE 150 MG PO TABS: 150.0000 mg | ORAL_TABLET | Freq: Once | ORAL | 0 refills | Status: AC

## 2021-12-14 LAB — URINE CULTURE: Culture: >=100000 — AB

## 2022-01-01 ENCOUNTER — Other Ambulatory Visit: Payer: Self-pay

## 2022-01-01 ENCOUNTER — Ambulatory Visit (HOSPITAL_COMMUNITY)
Admission: RE | Admit: 2022-01-01 | Discharge: 2022-01-01 | Disposition: A | Discharge status: Home / Self Care | Payer: Medicaid Other | Source: Ambulatory Visit | Attending: Physician Assistant | Admitting: Physician Assistant

## 2022-01-01 DIAGNOSIS — N200 Calculus of kidney: Secondary | ICD-10-CM | POA: Diagnosis not present

## 2022-01-01 DIAGNOSIS — I700 Atherosclerosis of aorta: Secondary | ICD-10-CM | POA: Diagnosis not present

## 2022-01-01 DIAGNOSIS — R1012 Left upper quadrant pain: Secondary | ICD-10-CM | POA: Insufficient documentation

## 2022-01-02 DIAGNOSIS — Z419 Encounter for procedure for purposes other than remedying health state, unspecified: Secondary | ICD-10-CM | POA: Diagnosis not present

## 2022-01-14 ENCOUNTER — Telehealth: Payer: Self-pay

## 2022-01-14 NOTE — Telephone Encounter (Signed)
Patient called and made aware. Pamphlet sent via mail.  ?

## 2022-01-14 NOTE — Telephone Encounter (Signed)
-----   Message from Sydnee Levans, [place] sent at 01/14/2022  1:17 PM EDT ----- ?Please check with pt to ensure her sxs have resolved and let her know her CT shows a few very small stones in both kidneys, but no sign of obstruction. Stone burden improved since her last CT. She also needs stone diet info sent to her please ?----- Message ----- ?From: Interface, Rad Results In ?Sent: 01/01/2022   8:27 AM EDT ?To: Regan Rakers Summerlin, PA-C ? ? ?

## 2022-01-24 DIAGNOSIS — Z30011 Encounter for initial prescription of contraceptive pills: Secondary | ICD-10-CM | POA: Diagnosis not present

## 2022-01-24 DIAGNOSIS — R300 Dysuria: Secondary | ICD-10-CM | POA: Diagnosis not present

## 2022-01-24 DIAGNOSIS — Z3040 Encounter for surveillance of contraceptives, unspecified: Secondary | ICD-10-CM | POA: Diagnosis not present

## 2022-01-24 DIAGNOSIS — N898 Other specified noninflammatory disorders of vagina: Secondary | ICD-10-CM | POA: Diagnosis not present

## 2022-01-24 DIAGNOSIS — Z113 Encounter for screening for infections with a predominantly sexual mode of transmission: Secondary | ICD-10-CM | POA: Diagnosis not present

## 2022-02-02 DIAGNOSIS — Z419 Encounter for procedure for purposes other than remedying health state, unspecified: Secondary | ICD-10-CM | POA: Diagnosis not present

## 2022-03-04 DIAGNOSIS — Z419 Encounter for procedure for purposes other than remedying health state, unspecified: Secondary | ICD-10-CM | POA: Diagnosis not present

## 2022-03-29 ENCOUNTER — Ambulatory Visit
Admission: EM | Admit: 2022-03-29 | Discharge: 2022-03-29 | Disposition: A | Discharge status: Home / Self Care | Payer: Medicaid Other | Attending: Physician Assistant | Admitting: Physician Assistant

## 2022-03-29 ENCOUNTER — Encounter: Payer: Self-pay | Admitting: Emergency Medicine

## 2022-03-29 DIAGNOSIS — L309 Dermatitis, unspecified: Secondary | ICD-10-CM

## 2022-03-29 DIAGNOSIS — N760 Acute vaginitis: Secondary | ICD-10-CM

## 2022-03-29 DIAGNOSIS — B9689 Other specified bacterial agents as the cause of diseases classified elsewhere: Secondary | ICD-10-CM | POA: Diagnosis not present

## 2022-03-29 LAB — POCT URINALYSIS DIP (MANUAL ENTRY)
Bilirubin, UA: NEGATIVE
Blood, UA: NEGATIVE
Glucose, UA: NEGATIVE mg/dL
Ketones, POC UA: NEGATIVE mg/dL
Leukocytes, UA: NEGATIVE
Nitrite, UA: NEGATIVE
Protein Ur, POC: NEGATIVE mg/dL
Spec Grav, UA: 1.01 (ref 1.010–1.025)
Urobilinogen, UA: 0.2 E.U./dL
pH, UA: 5.5 (ref 5.0–8.0)

## 2022-03-29 MED ORDER — TRIAMCINOLONE ACETONIDE 0.025 % EX OINT: 1.0000 "application " | TOPICAL_OINTMENT | Freq: Two times a day (BID) | CUTANEOUS | 0 refills | Status: AC

## 2022-03-29 MED ORDER — METRONIDAZOLE 500 MG PO TABS: 500.0000 mg | ORAL_TABLET | Freq: Two times a day (BID) | ORAL | 0 refills | Status: DC

## 2022-03-29 NOTE — ED Provider Notes (Signed)
[name]    CSN: [id number] Arrival date & time: 03/29/22  1535      History   Chief Complaint Chief Complaint  Patient presents with   Dysuria    HPI Theresa Shepherd is a 36 y.o. female.   Pt thinks she has BV.  Pt reports she has a discharge and itching.  She reports she has had bacterial vaginitis multiple times patient responds well to metronidazole she is requesting testing and treatment.  Patient reports she also has eczema and is out of triamcinolone.  The history is provided by the patient. No language interpreter was used.  Dysuria Pain quality:  Aching Pain severity:  No pain Relieved by:  Nothing Worsened by:  Nothing  Past Medical History:  Diagnosis Date   Asthma    as child   Eczema    Gestational diabetes    Infection    UTI   Kidney stones    UTI (urinary tract infection)     Patient Active Problem List   Diagnosis Date Noted   Encounter for maternal care for low transverse scar from repeat cesarean delivery 02/06/2021   S/P repeat low transverse C-section 02/06/2021   History of cesarean delivery x 1--2012, FTP 02/04/2021   History of COVID-19--12/2020 02/04/2021   History of urinary calculi--2021 02/04/2021   Gestational diabetes mellitus (GDM), antepartum 12/11/2020   GBS bacteriuria 09/19/2020    Past Surgical History:  Procedure Laterality Date   CESAREAN SECTION     CESAREAN SECTION N/A 02/06/2021   Procedure: CESAREAN SECTION;  Surgeon: Crawford Givens, MD;  Location: [facility];  Service: Obstetrics;  Laterality: N/A;   CYSTOSCOPY WITH RETROGRADE PYELOGRAM, URETEROSCOPY AND STENT PLACEMENT Left 08/02/2021   Procedure: CYSTOSCOPY WITH RETROGRADE PYELOGRAM, URETEROSCOPY AND STENT PLACEMENT;  Surgeon: Cleon Gustin, MD;  Location: [facility];  Service: Urology;  Laterality: Left;   INDUCED ABORTION      OB History     Gravida  4   Para  2   Term  2   Preterm      AB  1   Living  2      SAB      IAB  1    Ectopic      Multiple  0   Live Births  2            Home Medications    Prior to Admission medications   Medication Sig Start Date End Date Taking? Authorizing Provider  phenazopyridine (PYRIDIUM) 100 MG tablet Take 1 tablet (100 mg total) by mouth 3 (three) times daily as needed for pain. 12/12/21  Yes Summerlin, Berneice Heinrich, PA-C  Prenatal Vit-Fe Fumarate-FA (PRENATAL PLUS PO) Take by mouth.   Yes [provider]  triamcinolone (KENALOG) 0.025 % ointment Apply 1 application. topically 2 (two) times daily. 03/29/22  Yes Caryl Ada K, PA-C  diphenhydramine-acetaminophen (TYLENOL PM) 25-500 MG TABS tablet Take 1 tablet by mouth at bedtime as needed (sleep/pain).    [provider]  etonogestrel-ethinyl estradiol (NUVARING) 0.12-0.015 MG/24HR vaginal ring SMARTSIG:1 Ring Vaginal Once a Month 11/26/21   [provider]  ibuprofen (ADVIL) 200 MG tablet Take 600-800 mg by mouth every 6 (six) hours as needed for moderate pain or headache.    [provider]  lidocaine (XYLOCAINE) 2 % solution Use as directed 15 mLs in the mouth or throat as needed for mouth pain. 08/26/21   Teodora Medici, FNP  metroNIDAZOLE (FLAGYL) 500  MG tablet Take 1 tablet (500 mg total) by mouth 2 (two) times daily. 03/29/22   Fransico Meadow, PA-C  nitrofurantoin, macrocrystal-monohydrate, (MACROBID) 100 MG capsule Take 1 capsule (100 mg total) by mouth 2 (two) times daily. 12/11/21   Teodora Medici, FNP  ondansetron (ZOFRAN) 4 MG tablet Take 1 tablet (4 mg total) by mouth daily as needed for nausea or vomiting. 08/02/21 08/02/22  McKenzie, Candee Furbish, MD  oxycodone (OXY-IR) 5 MG capsule Take 1 capsule (5 mg total) by mouth every 4 (four) hours as needed. 08/03/21   McKenzie, Candee Furbish, MD  oxyCODONE-acetaminophen (PERCOCET) 5-325 MG tablet Take 1 tablet by mouth every 4 (four) hours as needed for severe pain. 08/02/21 08/02/22  Cleon Gustin, MD    Family History Family History   Problem Relation Age of Onset   Diabetes Maternal Grandmother    Heart disease Maternal Grandmother    Asthma Other    Hypertension Other    Diabetes Other    Heart disease Paternal Grandfather    Diabetes Mother    Hypertension Mother    COPD Mother    Hypertension Father    Stomach cancer Paternal Grandmother     Social History Social History   Tobacco Use   Smoking status: Never   Smokeless tobacco: Never  Vaping Use   Vaping Use: Never used  Substance Use Topics   Alcohol use: Not Currently    Alcohol/week: 3.0 standard drinks    Types: 3 Standard drinks or equivalent per week    Comment: 2-3x/month   Drug use: Not Currently    Types: Marijuana    Comment: last use 10/17/19     Allergies   Augmentin [amoxicillin-pot clavulanate]   Review of Systems Review of Systems  Genitourinary:  Positive for dysuria.  All other systems reviewed and are negative.   Physical Exam Triage Vital Signs ED Triage Vitals  Enc Vitals Group     BP 03/29/22 1648 117/71     Pulse Rate 03/29/22 1648 61     Resp 03/29/22 1648 12     Temp 03/29/22 1648 98 F (36.7 C)     Temp Source 03/29/22 1648 Oral     SpO2 03/29/22 1648 99 %     Weight --      Height --      Head Circumference --      Peak Flow --      Pain Score 03/29/22 1653 0     Pain Loc --      Pain Edu? --      Excl. in [place]? --    No data found.  Updated Vital Signs BP 117/71 (BP Location: Left Arm)   Pulse 61   Temp 98 F (36.7 C) (Oral)   Resp 12   LMP 02/14/2022 (Exact Date)   SpO2 99%   Breastfeeding Unknown   Visual Acuity Right Eye Distance:   Left Eye Distance:   Bilateral Distance:    Right Eye Near:   Left Eye Near:    Bilateral Near:     Physical Exam Vitals and nursing note reviewed.  Constitutional:      Appearance: She is well-developed.  HENT:     Head: Normocephalic.  Cardiovascular:     Rate and Rhythm: Normal rate.  Pulmonary:     Effort: Pulmonary effort is normal.   Abdominal:     General: There is no distension.  Musculoskeletal:  General: Normal range of motion.     Cervical back: Normal range of motion.  Skin:    Comments: Dry red raised rash  Neurological:     Mental Status: She is alert and oriented to person, place, and time.  Psychiatric:        Mood and Affect: Mood normal.     UC Treatments / Results  Labs (all labs ordered are listed, but only abnormal results are displayed) Labs Reviewed  POCT URINALYSIS DIP (MANUAL ENTRY) - Abnormal; Notable for the following components:      Result Value   Color, UA light yellow (*)    All other components within normal limits  CERVICOVAGINAL ANCILLARY ONLY    EKG   Radiology No results found.  Procedures Procedures (including critical care time)  Medications Ordered in UC Medications - No data to display  Initial Impression / Assessment and Plan / UC Course  I have reviewed the triage vital signs and the nursing notes.  Pertinent labs & imaging results that were available during my care of the patient were reviewed by me and considered in my medical decision making (see chart for details).      Final Clinical Impressions(s) / UC Diagnoses   Final diagnoses:  BV (bacterial vaginosis)  Eczema, unspecified type   Discharge Instructions   None    ED Prescriptions     Medication Sig Dispense Auth. Provider   metroNIDAZOLE (FLAGYL) 500 MG tablet Take 1 tablet (500 mg total) by mouth 2 (two) times daily. 14 tablet Bianco Cange K, [place]   triamcinolone (KENALOG) 0.025 % ointment Apply 1 application. topically 2 (two) times daily. 454 g Fransico Meadow, [place]      PDMP not reviewed this encounter. An After Visit Summary was printed and given to the patient.    Fransico Meadow, [place] 03/29/22 1724

## 2022-03-29 NOTE — ED Triage Notes (Signed)
Patient c/o dysuria x 5 days.   Patient endorses vaginal discharge with "creamy white" consistency.   Patient endorses cloudy urine. Patient endorses vaginal itching.   Patient denies foul vaginal odor. Patient denies ABD pain.   Patient has used AZO with no relief of symptoms.   Patient endorses LMP was 02/14/2022. Patient recently found out that she's pregnant, approximately 5 weeks per patient statement.

## 2022-04-02 LAB — CERVICOVAGINAL ANCILLARY ONLY
Bacterial Vaginitis (gardnerella): POSITIVE — AB
Candida Glabrata: POSITIVE — AB
Candida Vaginitis: NEGATIVE
Chlamydia: NEGATIVE
Comment: NEGATIVE
Comment: NEGATIVE
Comment: NEGATIVE
Comment: NEGATIVE
Comment: NEGATIVE
Comment: NORMAL
Neisseria Gonorrhea: NEGATIVE
Trichomonas: NEGATIVE

## 2022-04-02 IMAGING — CT CT RENAL STONE PROTOCOL
2 of 4 series · 16 of 46 positions shown, 18 images · non-contrast
Comparison: Renal ultrasound 06/29/2021. CT abdomen and pelvis
05/29/2020.

CLINICAL DATA: Left flank pain.  History of nephrolithiasis.



[Series 2: axial st · axial · 0.80mm/px · z∈[+1113,+1548]mm · 13 of 101 slices shown, 15 images]
[im 7/101  soft-tissue]
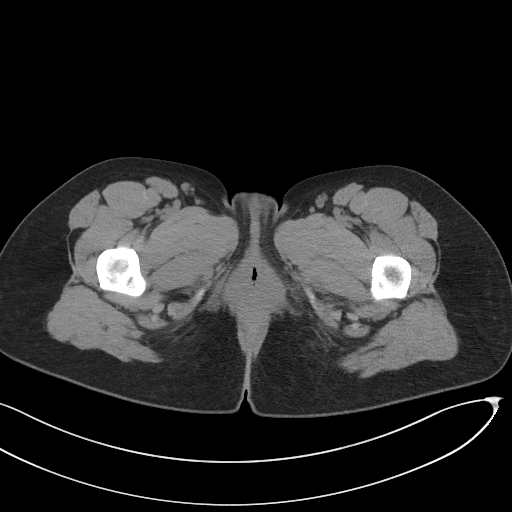
[im 7/101  bone]
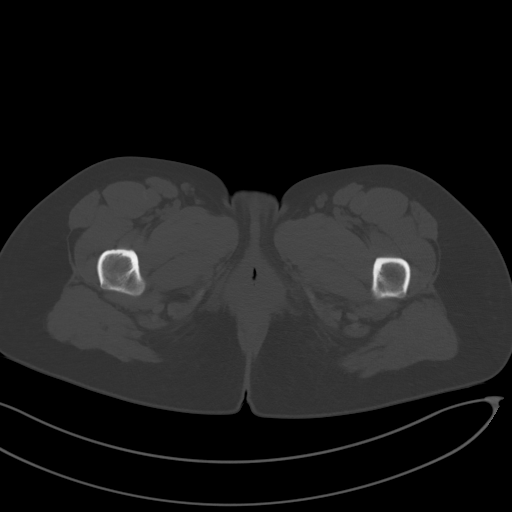
[im 13/101  soft-tissue]
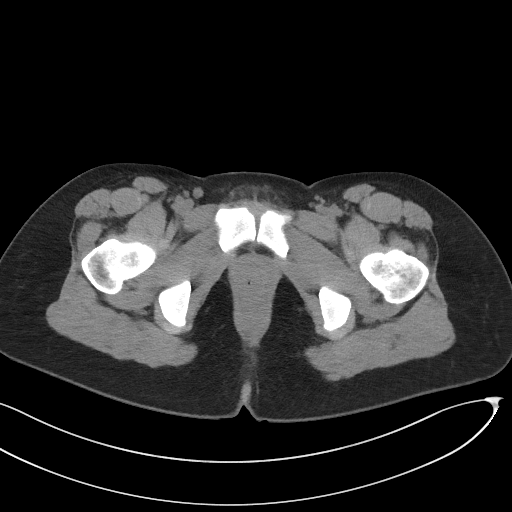
[im 19/101  soft-tissue]
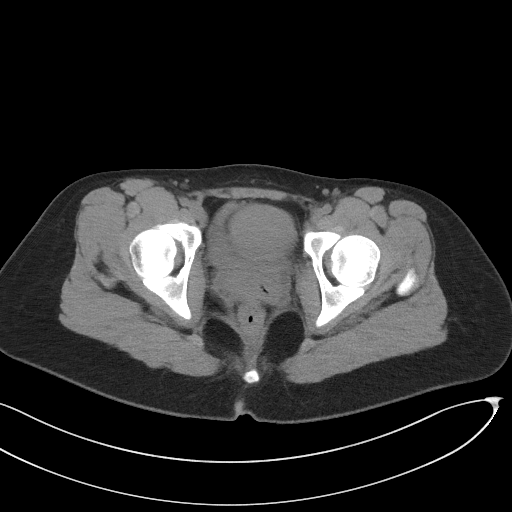
[im 32/101  soft-tissue]
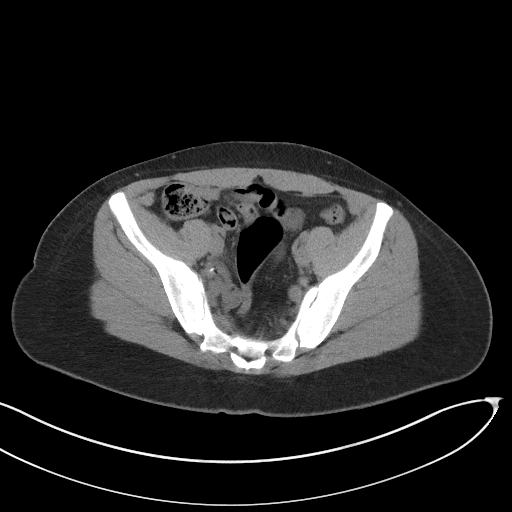
[im 38/101  soft-tissue]
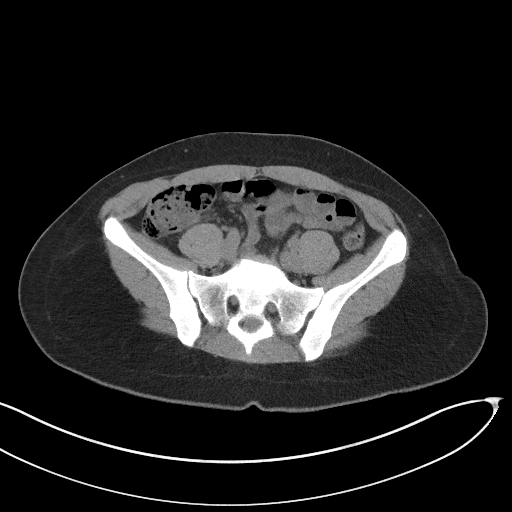
[im 44/101  soft-tissue]
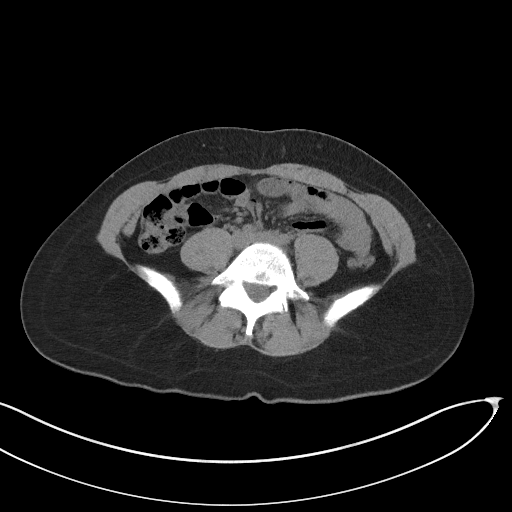
[im 51/101  soft-tissue]
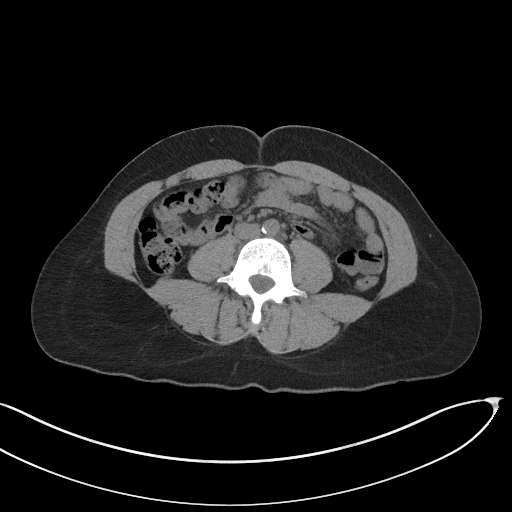
[im 57/101  soft-tissue]
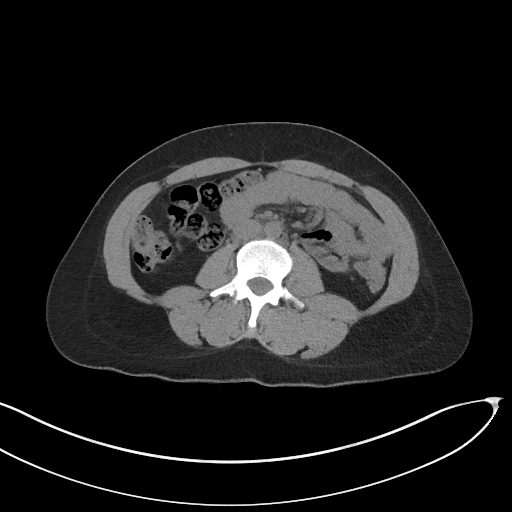
[im 63/101  soft-tissue]
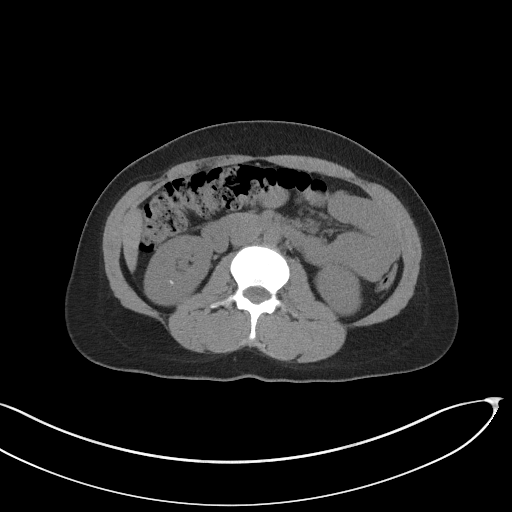
[im 63/101  bone]
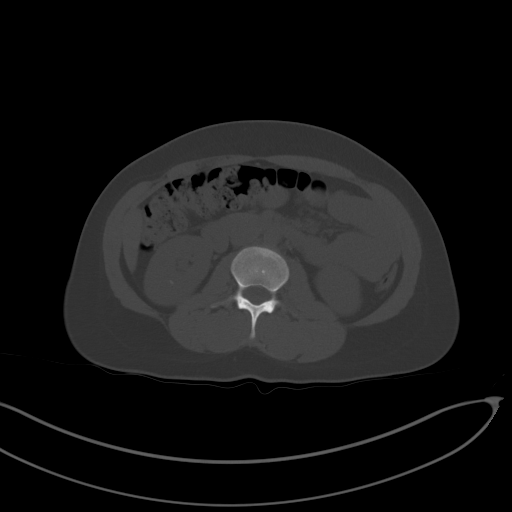
[im 69/101  soft-tissue]
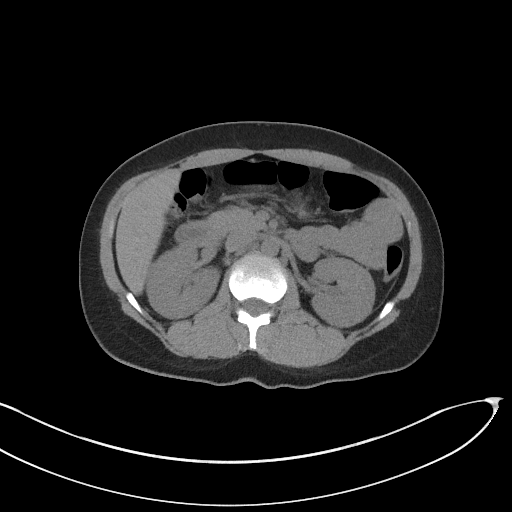
[im 82/101  soft-tissue]
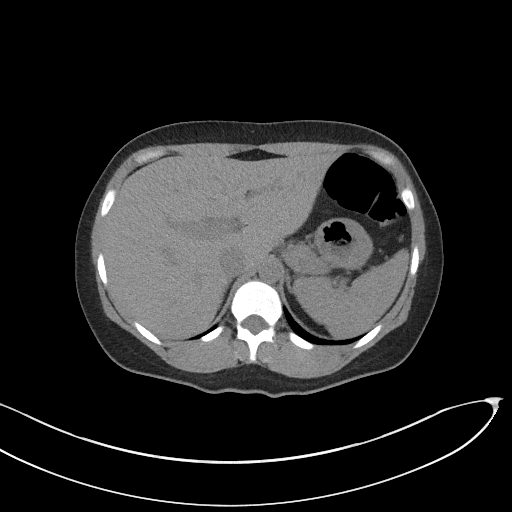
[im 88/101  soft-tissue]
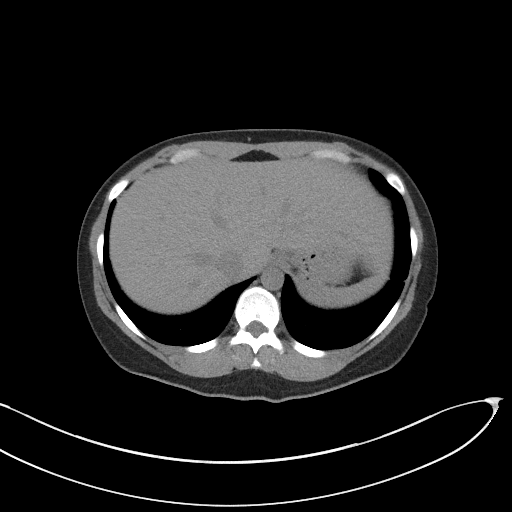
[im 94/101  soft-tissue]
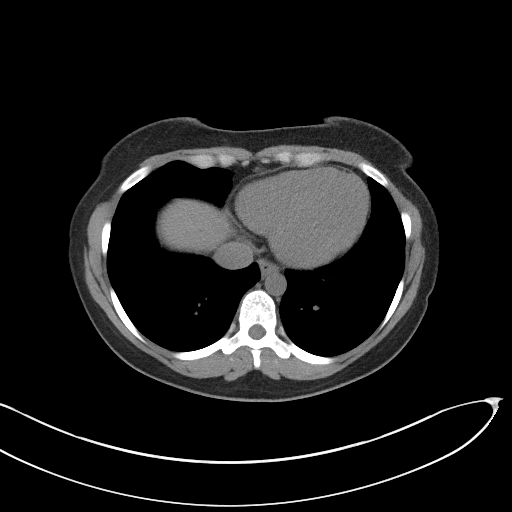

[Series 5: coronal st · coronal · 0.80mm/px · 3 of 100 slices shown]
[im 34/100  soft-tissue]
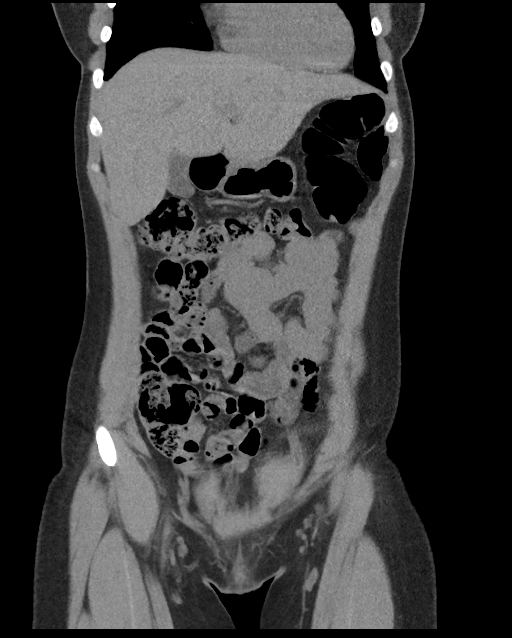
[im 45/100  soft-tissue]
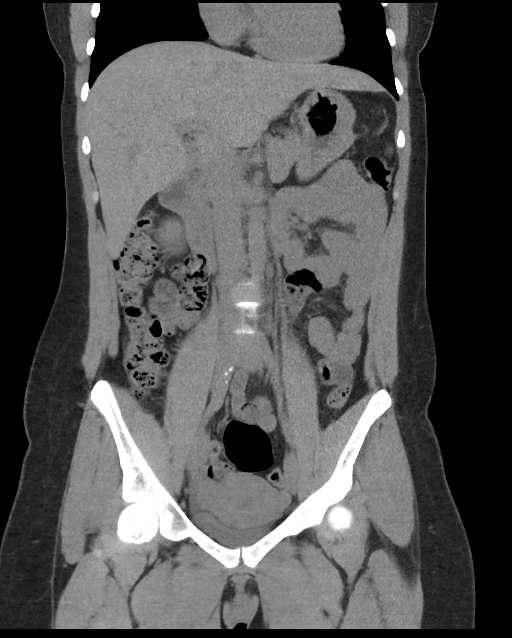
[im 56/100  soft-tissue]
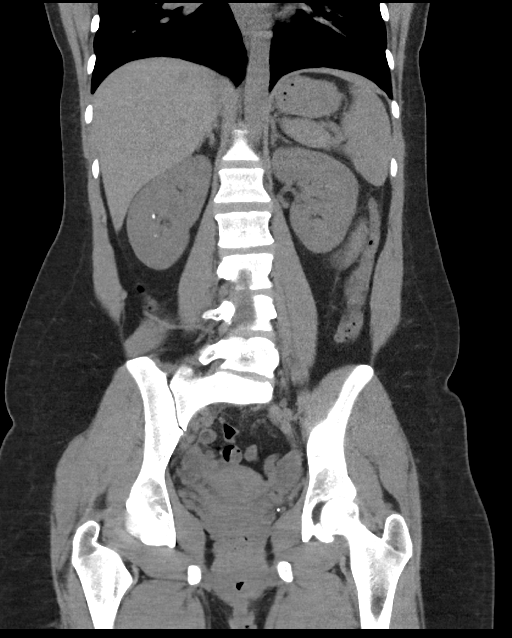

[16 of 46 positions shown; findings below may reference images not displayed]

FINDINGS: Lower chest: Unchanged mild scarring in the basilar left lower lobe.
No pleural effusion.

Hepatobiliary: No focal liver abnormality is seen. No gallstones,
gallbladder wall thickening, or biliary dilatation.

Pancreas: Unremarkable.

Spleen: Unremarkable.

Adrenals/Urinary Tract: Unremarkable adrenal glands. There are
several right renal calculi which measure up to 3 mm, and there is a
single 1 mm calculus in the lower pole of the left kidney. Overall
stone burden is slightly less than on the 7173 CT. No
hydronephrosis. No ureteral calculi. Collapsed bladder.

Stomach/Bowel: The stomach is unremarkable. There is no evidence of
bowel obstruction or inflammation. The appendix is unremarkable.

Vascular/Lymphatic: Minimal abdominal aortic atherosclerosis without
aneurysm. No enlarged lymph nodes.

Reproductive: Uterus and bilateral adnexa are unremarkable.

Other: No ascites or pneumoperitoneum.

Musculoskeletal: No acute osseous abnormality or suspicious osseous
lesion.
IMPRESSION: 1. Small bilateral nonobstructing renal calculi.
2. No acute abnormality identified in the abdomen or pelvis.
3. Aortic Atherosclerosis ([id number]).

## 2022-04-03 ENCOUNTER — Telehealth (HOSPITAL_COMMUNITY): Payer: Self-pay | Admitting: Emergency Medicine

## 2022-04-03 MED ORDER — TERCONAZOLE 0.4 % VA CREA: 1.0000 | TOPICAL_CREAM | Freq: Every day | VAGINAL | 0 refills | Status: AC

## 2022-04-04 DIAGNOSIS — Z419 Encounter for procedure for purposes other than remedying health state, unspecified: Secondary | ICD-10-CM | POA: Diagnosis not present

## 2022-04-24 ENCOUNTER — Encounter: Payer: Self-pay | Admitting: Emergency Medicine

## 2022-04-24 ENCOUNTER — Other Ambulatory Visit: Payer: Self-pay

## 2022-04-24 ENCOUNTER — Ambulatory Visit
Admission: EM | Admit: 2022-04-24 | Discharge: 2022-04-24 | Disposition: A | Discharge status: Home / Self Care | Payer: Medicaid Other | Attending: Internal Medicine | Admitting: Internal Medicine

## 2022-04-24 DIAGNOSIS — Z113 Encounter for screening for infections with a predominantly sexual mode of transmission: Secondary | ICD-10-CM | POA: Insufficient documentation

## 2022-04-24 DIAGNOSIS — N3001 Acute cystitis with hematuria: Secondary | ICD-10-CM | POA: Insufficient documentation

## 2022-04-24 DIAGNOSIS — R300 Dysuria: Secondary | ICD-10-CM | POA: Insufficient documentation

## 2022-04-24 LAB — POCT URINALYSIS DIP (MANUAL ENTRY)
Bilirubin, UA: NEGATIVE
Glucose, UA: 100 mg/dL — AB
Ketones, POC UA: NEGATIVE mg/dL
Leukocytes, UA: NEGATIVE
Nitrite, UA: POSITIVE — AB
Protein Ur, POC: NEGATIVE mg/dL
Spec Grav, UA: 1.02 (ref 1.010–1.025)
Urobilinogen, UA: 1 E.U./dL
pH, UA: 7 (ref 5.0–8.0)

## 2022-04-24 LAB — POCT URINE PREGNANCY: Preg Test, Ur: NEGATIVE

## 2022-04-24 MED ORDER — NITROFURANTOIN MONOHYD MACRO 100 MG PO CAPS
100.0000 mg | ORAL_CAPSULE | Freq: Two times a day (BID) | ORAL | 0 refills | Status: DC
Start: 2022-04-24 — End: 2022-07-04

## 2022-04-24 NOTE — ED Provider Notes (Signed)
[name]    CSN: [id number] Arrival date & time: 04/24/22  1519      History   Chief Complaint Chief Complaint  Patient presents with   Urinary Tract Infection    HPI Theresa Shepherd is a 36 y.o. female.   Patient presents with urinary burning, lower abdominal pain, lower back pain that started about 4 days ago.  Denies any associated vaginal discharge, hematuria, abnormal vaginal bleeding, fever.  Patient does have a history of recurrent urinary tract infections as well as history of kidney stones.  She is followed by urology. Patient has taken Azo for symptoms.  Patient would also like vaginal swab completed given that she also has some feelings of "vaginal irritation" and she has recurrent bacterial vaginosis as well.  Last menstrual cycle was about 1 month ago. Denies any exposure to STD but is open to STD testing.    Urinary Tract Infection   Past Medical History:  Diagnosis Date   Asthma    as child   Eczema    Gestational diabetes    Infection    UTI   Kidney stones    UTI (urinary tract infection)     Patient Active Problem List   Diagnosis Date Noted   Encounter for maternal care for low transverse scar from repeat cesarean delivery 02/06/2021   S/P repeat low transverse C-section 02/06/2021   History of cesarean delivery x 1--2012, FTP 02/04/2021   History of COVID-19--12/2020 02/04/2021   History of urinary calculi--2021 02/04/2021   Gestational diabetes mellitus (GDM), antepartum 12/11/2020   GBS bacteriuria 09/19/2020    Past Surgical History:  Procedure Laterality Date   CESAREAN SECTION     CESAREAN SECTION N/A 02/06/2021   Procedure: CESAREAN SECTION;  Surgeon: Jaymes Graff, MD;  Location: [facility];  Service: Obstetrics;  Laterality: N/A;   CYSTOSCOPY WITH RETROGRADE PYELOGRAM, URETEROSCOPY AND STENT PLACEMENT Left 08/02/2021   Procedure: CYSTOSCOPY WITH RETROGRADE PYELOGRAM, URETEROSCOPY AND STENT PLACEMENT;  Surgeon: Malen Gauze, MD;  Location: [facility];  Service: Urology;  Laterality: Left;   INDUCED ABORTION      OB History     Gravida  4   Para  2   Term  2   Preterm      AB  1   Living  2      SAB      IAB  1   Ectopic      Multiple  0   Live Births  2            Home Medications    Prior to Admission medications   Medication Sig Start Date End Date Taking? Authorizing Provider  nitrofurantoin, macrocrystal-monohydrate, (MACROBID) 100 MG capsule Take 1 capsule (100 mg total) by mouth 2 (two) times daily. 04/24/22  Yes Mauria Asquith, Rolly Salter E, FNP  diphenhydramine-acetaminophen (TYLENOL PM) 25-500 MG TABS tablet Take 1 tablet by mouth at bedtime as needed (sleep/pain).    [provider]  etonogestrel-ethinyl estradiol (NUVARING) 0.12-0.015 MG/24HR vaginal ring SMARTSIG:1 Ring Vaginal Once a Month Patient not taking: Reported on 04/24/2022 11/26/21   [provider]  ibuprofen (ADVIL) 200 MG tablet Take 600-800 mg by mouth every 6 (six) hours as needed for moderate pain or headache.    [provider]  oxycodone (OXY-IR) 5 MG capsule Take 1 capsule (5 mg total) by mouth every 4 (four) hours as needed. Patient not taking: Reported on 04/24/2022 08/03/21   Malen Gauze,  MD  oxyCODONE-acetaminophen (PERCOCET) 5-325 MG tablet Take 1 tablet by mouth every 4 (four) hours as needed for severe pain. Patient not taking: Reported on 04/24/2022 08/02/21 08/02/22  Malen Gauze, MD  phenazopyridine (PYRIDIUM) 100 MG tablet Take 1 tablet (100 mg total) by mouth 3 (three) times daily as needed for pain. 12/12/21   Summerlin, Regan Rakers, PA-C  Prenatal Vit-Fe Fumarate-FA (PRENATAL PLUS PO) Take by mouth.    [provider]  triamcinolone (KENALOG) 0.025 % ointment Apply 1 application. topically 2 (two) times daily. 03/29/22   Elson Areas, PA-C    Family History Family History  Problem Relation Age of Onset   Diabetes Maternal Grandmother    Heart  disease Maternal Grandmother    Asthma Other    Hypertension Other    Diabetes Other    Heart disease Paternal Grandfather    Diabetes Mother    Hypertension Mother    COPD Mother    Hypertension Father    Stomach cancer Paternal Grandmother     Social History Social History   Tobacco Use   Smoking status: Never   Smokeless tobacco: Never  Vaping Use   Vaping Use: Never used  Substance Use Topics   Alcohol use: Not Currently    Alcohol/week: 3.0 standard drinks of alcohol    Types: 3 Standard drinks or equivalent per week    Comment: 2-3x/month   Drug use: Not Currently    Types: Marijuana    Comment: last use 10/17/19     Allergies   Augmentin [amoxicillin-pot clavulanate]   Review of Systems Review of Systems Per HPI  Physical Exam Triage Vital Signs ED Triage Vitals  Enc Vitals Group     BP 04/24/22 1554 106/68     Pulse Rate 04/24/22 1554 66     Resp 04/24/22 1554 18     Temp 04/24/22 1554 98 F (36.7 C)     Temp Source 04/24/22 1554 Oral     SpO2 04/24/22 1554 95 %     Weight --      Height --      Head Circumference --      Peak Flow --      Pain Score 04/24/22 1551 10     Pain Loc --      Pain Edu? --      Excl. in GC? --    No data found.  Updated Vital Signs BP 106/68 (BP Location: Left Arm)   Pulse 66   Temp 98 F (36.7 C) (Oral)   Resp 18   LMP 02/23/2022 (Exact Date)   SpO2 95%   Breastfeeding Unknown   Visual Acuity Right Eye Distance:   Left Eye Distance:   Bilateral Distance:    Right Eye Near:   Left Eye Near:    Bilateral Near:     Physical Exam Constitutional:      General: She is not in acute distress.    Appearance: Normal appearance. She is not toxic-appearing or diaphoretic.  HENT:     Head: Normocephalic and atraumatic.  Eyes:     Extraocular Movements: Extraocular movements intact.     Conjunctiva/sclera: Conjunctivae normal.  Cardiovascular:     Rate and Rhythm: Normal rate and regular rhythm.      Pulses: Normal pulses.     Heart sounds: Normal heart sounds.  Pulmonary:     Effort: Pulmonary effort is normal. No respiratory distress.     Breath sounds: Normal breath sounds.  Abdominal:     General: Abdomen is flat. Bowel sounds are normal. There is no distension.     Palpations: Abdomen is soft.     Tenderness: There is no abdominal tenderness.  Genitourinary:    Comments: Deferred with shared decision making.  Self swab performed. Neurological:     General: No focal deficit present.     Mental Status: She is alert and oriented to person, place, and time. Mental status is at baseline.  Psychiatric:        Mood and Affect: Mood normal.        Behavior: Behavior normal.        Thought Content: Thought content normal.        Judgment: Judgment normal.      UC Treatments / Results  Labs (all labs ordered are listed, but only abnormal results are displayed) Labs Reviewed  POCT URINALYSIS DIP (MANUAL ENTRY) - Abnormal; Notable for the following components:      Result Value   Color, UA orange (*)    Clarity, UA cloudy (*)    Glucose, UA =100 (*)    Blood, UA trace-intact (*)    Nitrite, UA Positive (*)    All other components within normal limits  URINE CULTURE  POCT URINE PREGNANCY  CERVICOVAGINAL ANCILLARY ONLY    EKG   Radiology No results found.  Procedures Procedures (including critical care time)  Medications Ordered in UC Medications - No data to display  Initial Impression / Assessment and Plan / UC Course  I have reviewed the triage vital signs and the nursing notes.  Pertinent labs & imaging results that were available during my care of the patient were reviewed by me and considered in my medical decision making (see chart for details).     Urinalysis indicating urinary tract infection.  Will treat with Macrobid antibiotic.  Urine culture pending.  Cervicovaginal swab pending per patient request.  Will await results for any further treatment.   Patient to refrain from sexual activity until test results and treatment are complete.  Discussed return precautions.  Patient verbalized understanding and was agreeable with plan. Final Clinical Impressions(s) / UC Diagnoses   Final diagnoses:  Acute cystitis with hematuria  Dysuria  Screening examination for venereal disease     Discharge Instructions      It appears you have a urinary tract infection so you are being treated with antibiotic.  Urine culture and vaginal swab are pending.  We will call they are abnormal.    ED Prescriptions     Medication Sig Dispense Auth. Provider   nitrofurantoin, macrocrystal-monohydrate, (MACROBID) 100 MG capsule Take 1 capsule (100 mg total) by mouth 2 (two) times daily. 10 capsule Gustavus Bryant, [place]      PDMP not reviewed this encounter.   Gustavus Bryant, [place] 04/24/22 [phone]

## 2022-04-24 NOTE — Discharge Instructions (Signed)
It appears you have a urinary tract infection so you are being treated with antibiotic.  Urine culture and vaginal swab are pending.  We will call they are abnormal.

## 2022-04-24 NOTE — ED Triage Notes (Addendum)
Burning with urination started Sunday.  Pain in lower back and left lower abdomen.  Patient has been taken AZO

## 2022-04-25 LAB — CERVICOVAGINAL ANCILLARY ONLY
Bacterial Vaginitis (gardnerella): POSITIVE — AB
Candida Glabrata: NEGATIVE
Candida Vaginitis: POSITIVE — AB
Chlamydia: NEGATIVE
Comment: NEGATIVE
Comment: NEGATIVE
Comment: NEGATIVE
Comment: NEGATIVE
Comment: NEGATIVE
Comment: NORMAL
Neisseria Gonorrhea: NEGATIVE
Trichomonas: NEGATIVE

## 2022-04-25 LAB — URINE CULTURE: Culture: NO GROWTH

## 2022-04-26 ENCOUNTER — Telehealth (HOSPITAL_COMMUNITY): Payer: Self-pay | Admitting: Emergency Medicine

## 2022-04-26 MED ORDER — FLUCONAZOLE 150 MG PO TABS: 150.0000 mg | ORAL_TABLET | Freq: Once | ORAL | 0 refills | Status: AC

## 2022-04-26 MED ORDER — METRONIDAZOLE 500 MG PO TABS: 500.0000 mg | ORAL_TABLET | Freq: Two times a day (BID) | ORAL | 0 refills | Status: DC

## 2022-05-04 DIAGNOSIS — Z419 Encounter for procedure for purposes other than remedying health state, unspecified: Secondary | ICD-10-CM | POA: Diagnosis not present

## 2022-06-04 DIAGNOSIS — Z419 Encounter for procedure for purposes other than remedying health state, unspecified: Secondary | ICD-10-CM | POA: Diagnosis not present

## 2022-07-04 ENCOUNTER — Ambulatory Visit
Admission: EM | Admit: 2022-07-04 | Discharge: 2022-07-04 | Disposition: A | Discharge status: Home / Self Care | Payer: Medicaid Other | Attending: Physician Assistant | Admitting: Physician Assistant

## 2022-07-04 ENCOUNTER — Encounter: Payer: Self-pay | Admitting: Emergency Medicine

## 2022-07-04 ENCOUNTER — Other Ambulatory Visit: Payer: Self-pay

## 2022-07-04 DIAGNOSIS — N898 Other specified noninflammatory disorders of vagina: Secondary | ICD-10-CM | POA: Diagnosis not present

## 2022-07-04 DIAGNOSIS — N760 Acute vaginitis: Secondary | ICD-10-CM | POA: Insufficient documentation

## 2022-07-04 DIAGNOSIS — R300 Dysuria: Secondary | ICD-10-CM | POA: Insufficient documentation

## 2022-07-04 LAB — POCT URINALYSIS DIP (MANUAL ENTRY)
Bilirubin, UA: NEGATIVE
Glucose, UA: NEGATIVE mg/dL
Ketones, POC UA: NEGATIVE mg/dL
Leukocytes, UA: NEGATIVE
Nitrite, UA: NEGATIVE
Spec Grav, UA: >=1.03 — AB (ref 1.010–1.025)
Urobilinogen, UA: 1 E.U./dL
pH, UA: 6.5 (ref 5.0–8.0)

## 2022-07-04 LAB — POCT URINE PREGNANCY: Preg Test, Ur: NEGATIVE

## 2022-07-04 MED ORDER — METRONIDAZOLE 500 MG PO TABS: 500.0000 mg | ORAL_TABLET | Freq: Two times a day (BID) | ORAL | 0 refills | Status: DC

## 2022-07-04 NOTE — ED Triage Notes (Signed)
Patient reports recurrent issues with BV since having her child.  Reports vaginal discharge and itch.  Burning with urination.  Has tried 3 day remedy for yeast, has tried azo

## 2022-07-04 NOTE — ED Provider Notes (Signed)
[name]    CSN: [id number] Arrival date & time: 07/04/22  1850      History   Chief Complaint Chief Complaint  Patient presents with   Vaginal Discharge    HPI Theresa Shepherd is a 36 y.o. female.   Patient is today with a week of vaginal irritation as well as discharge.  Reports that she does have some dysuria but feels this is more coming from vaginal irritation than urine.  Denies any frequency, urgency, pelvic pain, abdominal pain, fever, nausea, vomiting.  She has tried over-the-counter Azo as well as medication for yeast without improvement of symptoms.  She does have a history of recurrent bacterial vaginosis and is concerned for that given her current symptoms.  She does report increased discharge as well as associated odor.  She is scheduled to see her OB/GYN in the near future due to these recurrent symptoms.  She has been told to use probiotics and boric acid suppositories but has not yet been able to do these consistently.  She denies additional changes to personal hygiene products including soaps or detergents.  Denies additional antibiotic use.    Past Medical History:  Diagnosis Date   Asthma    as child   Eczema    Gestational diabetes    Infection    UTI   Kidney stones    UTI (urinary tract infection)     Patient Active Problem List   Diagnosis Date Noted   Encounter for maternal care for low transverse scar from repeat cesarean delivery 02/06/2021   S/P repeat low transverse C-section 02/06/2021   History of cesarean delivery x 1--2012, FTP 02/04/2021   History of COVID-19--12/2020 02/04/2021   History of urinary calculi--2021 02/04/2021   Gestational diabetes mellitus (GDM), antepartum 12/11/2020   GBS bacteriuria 09/19/2020    Past Surgical History:  Procedure Laterality Date   CESAREAN SECTION     CESAREAN SECTION N/A 02/06/2021   Procedure: CESAREAN SECTION;  Surgeon: Jaymes Graff, MD;  Location: [facility];  Service: Obstetrics;   Laterality: N/A;   CYSTOSCOPY WITH RETROGRADE PYELOGRAM, URETEROSCOPY AND STENT PLACEMENT Left 08/02/2021   Procedure: CYSTOSCOPY WITH RETROGRADE PYELOGRAM, URETEROSCOPY AND STENT PLACEMENT;  Surgeon: Malen Gauze, MD;  Location: [facility];  Service: Urology;  Laterality: Left;   INDUCED ABORTION      OB History     Gravida  4   Para  2   Term  2   Preterm      AB  1   Living  2      SAB      IAB  1   Ectopic      Multiple  0   Live Births  2            Home Medications    Prior to Admission medications   Medication Sig Start Date End Date Taking? Authorizing Provider  diphenhydramine-acetaminophen (TYLENOL PM) 25-500 MG TABS tablet Take 1 tablet by mouth at bedtime as needed (sleep/pain).    [provider]  etonogestrel-ethinyl estradiol (NUVARING) 0.12-0.015 MG/24HR vaginal ring SMARTSIG:1 Ring Vaginal Once a Month Patient not taking: Reported on 04/24/2022 11/26/21   [provider]  ibuprofen (ADVIL) 200 MG tablet Take 600-800 mg by mouth every 6 (six) hours as needed for moderate pain or headache.    [provider]  metroNIDAZOLE (FLAGYL) 500 MG tablet Take 1 tablet (500 mg total) by mouth 2 (two) times daily. 07/04/22  Madilyne Tadlock, Noberto Retort, PA-C  Prenatal Vit-Fe Fumarate-FA (PRENATAL PLUS PO) Take by mouth.    [provider]  triamcinolone (KENALOG) 0.025 % ointment Apply 1 application. topically 2 (two) times daily. 03/29/22   Elson Areas, PA-C    Family History Family History  Problem Relation Age of Onset   Diabetes Maternal Grandmother    Heart disease Maternal Grandmother    Asthma Other    Hypertension Other    Diabetes Other    Heart disease Paternal Grandfather    Diabetes Mother    Hypertension Mother    COPD Mother    Hypertension Father    Stomach cancer Paternal Grandmother     Social History Social History   Tobacco Use   Smoking status: Never   Smokeless tobacco: Never  Vaping Use    Vaping Use: Never used  Substance Use Topics   Alcohol use: Not Currently    Alcohol/week: 3.0 standard drinks of alcohol    Types: 3 Standard drinks or equivalent per week    Comment: 2-3x/month   Drug use: Not Currently    Types: Marijuana     Allergies   Augmentin [amoxicillin-pot clavulanate]   Review of Systems Review of Systems  Constitutional:  Negative for activity change, appetite change, fatigue and fever.  Gastrointestinal:  Negative for abdominal pain, diarrhea, nausea and vomiting.  Genitourinary:  Positive for dysuria, vaginal discharge and vaginal pain. Negative for frequency, genital sores, hematuria, pelvic pain, urgency and vaginal bleeding.     Physical Exam Triage Vital Signs ED Triage Vitals  Enc Vitals Group     BP 07/04/22 1921 115/76     Pulse Rate 07/04/22 1921 76     Resp 07/04/22 1921 18     Temp 07/04/22 1921 (!) 97.5 F (36.4 C)     Temp Source 07/04/22 1921 Oral     SpO2 07/04/22 1921 97 %     Weight --      Height --      Head Circumference --      Peak Flow --      Pain Score 07/04/22 1918 0     Pain Loc --      Pain Edu? --      Excl. in GC? --    No data found.  Updated Vital Signs BP 115/76 (BP Location: Left Arm)   Pulse 76   Temp (!) 97.5 F (36.4 C) (Oral)   Resp 18   LMP 06/20/2022   SpO2 97%   Visual Acuity Right Eye Distance:   Left Eye Distance:   Bilateral Distance:    Right Eye Near:   Left Eye Near:    Bilateral Near:     Physical Exam Vitals reviewed.  Constitutional:      General: She is awake. She is not in acute distress.    Appearance: Normal appearance. She is well-developed. She is not ill-appearing.     Comments: Very pleasant female appears stated age in no acute distress sitting comfortably in exam room  HENT:     Head: Normocephalic and atraumatic.  Cardiovascular:     Rate and Rhythm: Normal rate and regular rhythm.     Heart sounds: Normal heart sounds, S1 normal and S2 normal. No murmur  heard. Pulmonary:     Effort: Pulmonary effort is normal.     Breath sounds: Normal breath sounds. No wheezing, rhonchi or rales.     Comments: Clear to auscultation bilaterally Abdominal:  General: Bowel sounds are normal.     Palpations: Abdomen is soft.     Tenderness: There is no abdominal tenderness. There is no right CVA tenderness, left CVA tenderness, guarding or rebound.     Comments: Benign abdominal exam  Genitourinary:    Comments: Exam deferred Psychiatric:        Behavior: Behavior is cooperative.      UC Treatments / Results  Labs (all labs ordered are listed, but only abnormal results are displayed) Labs Reviewed  POCT URINALYSIS DIP (MANUAL ENTRY) - Abnormal; Notable for the following components:      Result Value   Spec Grav, UA >=1.030 (*)    Blood, UA trace-intact (*)    Protein Ur, POC trace (*)    All other components within normal limits  POCT URINE PREGNANCY  CERVICOVAGINAL ANCILLARY ONLY    EKG   Radiology No results found.  Procedures Procedures (including critical care time)  Medications Ordered in UC Medications - No data to display  Initial Impression / Assessment and Plan / UC Course  I have reviewed the triage vital signs and the nursing notes.  Pertinent labs & imaging results that were available during my care of the patient were reviewed by me and considered in my medical decision making (see chart for details).     Urine pregnancy was negative.  UA was negative for leukocytes and nitrates with only increased concentration and trace blood.  Patient believes that her symptoms are more likely related to bacterial vaginosis so we will empirically treat for that.  STI swab was collected today and we will contact her if we need to arrange additional treatment.  Metronidazole 500 mg sent to the pharmacy.  Discussed that she should avoid alcohol consumption with this medication due to Antabuse side effects.  Recommended hypoallergenic  soaps and detergents and wearing loosefitting cotton underwear.  She is to use boric acid suppositories as well as probiotics under the direction of her OB/GYN.  Did recommend that she follow-up with her OB/GYN given her current symptoms.  If she develops any additional symptoms including persistent abdominal pain, pelvic pain, persistent UTI symptoms, fever, nausea, vomiting she needs to be seen immediately.  Strict return precautions given.  Final Clinical Impressions(s) / UC Diagnoses   Final diagnoses:  Acute vaginitis  Vaginal discharge  Dysuria     Discharge Instructions      Start metronidazole as prescribed.  Do not drink any alcohol on this medication as it can cause significant nausea and vomiting.  Wear loosefitting cotton underwear and use hypoallergenic soaps and detergents.  Follow-up with your OB/GYN as we discussed.  If you have pelvic pain, abdominal pain, fever, nausea, vomiting, urinary symptoms you should be seen again.     ED Prescriptions     Medication Sig Dispense Auth. Provider   metroNIDAZOLE (FLAGYL) 500 MG tablet Take 1 tablet (500 mg total) by mouth 2 (two) times daily. 14 tablet Yarnell Kozloski, Noberto Retort, PA-C      PDMP not reviewed this encounter.   Jeani Hawking, PA-C 07/04/22 1946

## 2022-07-04 NOTE — Discharge Instructions (Signed)
Start metronidazole as prescribed.  Do not drink any alcohol on this medication as it can cause significant nausea and vomiting.  Wear loosefitting cotton underwear and use hypoallergenic soaps and detergents.  Follow-up with your OB/GYN as we discussed.  If you have pelvic pain, abdominal pain, fever, nausea, vomiting, urinary symptoms you should be seen again.

## 2022-07-05 DIAGNOSIS — Z419 Encounter for procedure for purposes other than remedying health state, unspecified: Secondary | ICD-10-CM | POA: Diagnosis not present

## 2022-07-09 LAB — CERVICOVAGINAL ANCILLARY ONLY
Bacterial Vaginitis (gardnerella): POSITIVE — AB
Candida Glabrata: NEGATIVE
Candida Vaginitis: NEGATIVE
Chlamydia: NEGATIVE
Comment: NEGATIVE
Comment: NEGATIVE
Comment: NEGATIVE
Comment: NEGATIVE
Comment: NEGATIVE
Comment: NORMAL
Neisseria Gonorrhea: NEGATIVE
Trichomonas: NEGATIVE

## 2022-07-25 DIAGNOSIS — N898 Other specified noninflammatory disorders of vagina: Secondary | ICD-10-CM | POA: Diagnosis not present

## 2022-07-25 DIAGNOSIS — N760 Acute vaginitis: Secondary | ICD-10-CM | POA: Diagnosis not present

## 2022-07-25 DIAGNOSIS — T283XXA Burn of internal genitourinary organs, initial encounter: Secondary | ICD-10-CM | POA: Diagnosis not present

## 2022-08-04 DIAGNOSIS — Z419 Encounter for procedure for purposes other than remedying health state, unspecified: Secondary | ICD-10-CM | POA: Diagnosis not present

## 2022-08-06 DIAGNOSIS — Z113 Encounter for screening for infections with a predominantly sexual mode of transmission: Secondary | ICD-10-CM | POA: Diagnosis not present

## 2022-08-06 DIAGNOSIS — N898 Other specified noninflammatory disorders of vagina: Secondary | ICD-10-CM | POA: Diagnosis not present

## 2022-08-14 ENCOUNTER — Ambulatory Visit
Admission: EM | Admit: 2022-08-14 | Discharge: 2022-08-14 | Disposition: A | Discharge status: Home / Self Care | Payer: Medicaid Other | Attending: Internal Medicine | Admitting: Internal Medicine

## 2022-08-14 DIAGNOSIS — Z202 Contact with and (suspected) exposure to infections with a predominantly sexual mode of transmission: Secondary | ICD-10-CM | POA: Insufficient documentation

## 2022-08-14 DIAGNOSIS — R300 Dysuria: Secondary | ICD-10-CM | POA: Diagnosis not present

## 2022-08-14 DIAGNOSIS — Z113 Encounter for screening for infections with a predominantly sexual mode of transmission: Secondary | ICD-10-CM | POA: Insufficient documentation

## 2022-08-14 LAB — POCT URINALYSIS DIP (MANUAL ENTRY)
Bilirubin, UA: NEGATIVE
Blood, UA: NEGATIVE
Glucose, UA: NEGATIVE mg/dL
Ketones, POC UA: NEGATIVE mg/dL
Leukocytes, UA: NEGATIVE
Nitrite, UA: NEGATIVE
Protein Ur, POC: NEGATIVE mg/dL
Spec Grav, UA: >=1.03 — AB (ref 1.010–1.025)
Urobilinogen, UA: 0.2 E.U./dL
pH, UA: 6 (ref 5.0–8.0)

## 2022-08-14 LAB — POCT URINE PREGNANCY: Preg Test, Ur: NEGATIVE

## 2022-08-14 MED ORDER — DOXYCYCLINE HYCLATE 100 MG PO CAPS: 100.0000 mg | ORAL_CAPSULE | Freq: Two times a day (BID) | ORAL | 0 refills | Status: DC

## 2022-08-14 NOTE — Discharge Instructions (Addendum)
I am prophylactically treating you with doxycycline antibiotic for chlamydia exposure.  Please take this medication with food as to prevent nausea.  Urine pregnancy was negative.  Please refrain from sexual activity until test results and treatment are complete.  Your vaginal swab is pending.  We will call if it is positive.

## 2022-08-14 NOTE — ED Triage Notes (Signed)
Pt c/o chlamydia exposure. Dysuria. Vaginal discharge that spontaneously resolved. Abd pain.

## 2022-08-14 NOTE — ED Provider Notes (Signed)
[name]    CSN: [id number] Arrival date & time: 08/14/22  1637      History   Chief Complaint Chief Complaint  Patient presents with   sti screening    HPI Theresa Shepherd is a 36 y.o. female.   Patient presents after chlamydia exposure from recent sexual partner.  Patient reports that she originally went to her gynecologist and they tested her, and test results were negative for any STDs.  She states that they did not put her on any prophylactic medications for STD exposure.  They did put her on MetroGel given that she has recurrent BV to prevent infections.  Therefore, she is not sure if she is not having any vaginal discharge.  She does report some intermittent dysuria and some mild lower abdominal cramping since STD exposure.  She states her sexual partner has been treated and tested for chlamydia.  Denies urinary frequency, pelvic pain, back pain, fever, hematuria, abnormal vaginal bleeding.  Last menstrual cycle was about a month ago and patient states that she is about to start her menstrual cycle.     Past Medical History:  Diagnosis Date   Asthma    as child   Eczema    Gestational diabetes    Infection    UTI   Kidney stones    UTI (urinary tract infection)     Patient Active Problem List   Diagnosis Date Noted   Encounter for maternal care for low transverse scar from repeat cesarean delivery 02/06/2021   S/P repeat low transverse C-section 02/06/2021   History of cesarean delivery x 1--2012, FTP 02/04/2021   History of COVID-19--12/2020 02/04/2021   History of urinary calculi--2021 02/04/2021   Gestational diabetes mellitus (GDM), antepartum 12/11/2020   GBS bacteriuria 09/19/2020    Past Surgical History:  Procedure Laterality Date   CESAREAN SECTION     CESAREAN SECTION N/A 02/06/2021   Procedure: CESAREAN SECTION;  Surgeon: Jaymes Graff, MD;  Location: [facility];  Service: Obstetrics;  Laterality: N/A;   CYSTOSCOPY WITH RETROGRADE  PYELOGRAM, URETEROSCOPY AND STENT PLACEMENT Left 08/02/2021   Procedure: CYSTOSCOPY WITH RETROGRADE PYELOGRAM, URETEROSCOPY AND STENT PLACEMENT;  Surgeon: Malen Gauze, MD;  Location: [facility];  Service: Urology;  Laterality: Left;   INDUCED ABORTION      OB History     Gravida  4   Para  2   Term  2   Preterm      AB  1   Living  2      SAB      IAB  1   Ectopic      Multiple  0   Live Births  2            Home Medications    Prior to Admission medications   Medication Sig Start Date End Date Taking? Authorizing Provider  doxycycline (VIBRAMYCIN) 100 MG capsule Take 1 capsule (100 mg total) by mouth 2 (two) times daily. 08/14/22  Yes Clary Meeker, Rolly Salter E, FNP  diphenhydramine-acetaminophen (TYLENOL PM) 25-500 MG TABS tablet Take 1 tablet by mouth at bedtime as needed (sleep/pain).    [provider]  etonogestrel-ethinyl estradiol (NUVARING) 0.12-0.015 MG/24HR vaginal ring SMARTSIG:1 Ring Vaginal Once a Month Patient not taking: Reported on 04/24/2022 11/26/21   [provider]  ibuprofen (ADVIL) 200 MG tablet Take 600-800 mg by mouth every 6 (six) hours as needed for moderate pain or headache.    [provider]  metroNIDAZOLE (  FLAGYL) 500 MG tablet Take 1 tablet (500 mg total) by mouth 2 (two) times daily. 07/04/22   Raspet, Noberto Retort, PA-C  Prenatal Vit-Fe Fumarate-FA (PRENATAL PLUS PO) Take by mouth.    [provider]  triamcinolone (KENALOG) 0.025 % ointment Apply 1 application. topically 2 (two) times daily. 03/29/22   Elson Areas, PA-C    Family History Family History  Problem Relation Age of Onset   Diabetes Maternal Grandmother    Heart disease Maternal Grandmother    Asthma Other    Hypertension Other    Diabetes Other    Heart disease Paternal Grandfather    Diabetes Mother    Hypertension Mother    COPD Mother    Hypertension Father    Stomach cancer Paternal Grandmother     Social History Social History    Tobacco Use   Smoking status: Never   Smokeless tobacco: Never  Vaping Use   Vaping Use: Never used  Substance Use Topics   Alcohol use: Not Currently    Alcohol/week: 3.0 standard drinks of alcohol    Types: 3 Standard drinks or equivalent per week    Comment: 2-3x/month   Drug use: Not Currently    Types: Marijuana     Allergies   Augmentin [amoxicillin-pot clavulanate]   Review of Systems Review of Systems Per HPI  Physical Exam Triage Vital Signs ED Triage Vitals  Enc Vitals Group     BP 08/14/22 1645 107/60     Pulse Rate 08/14/22 1645 75     Resp 08/14/22 1645 16     Temp 08/14/22 1645 97.6 F (36.4 C)     Temp Source 08/14/22 1645 Oral     SpO2 08/14/22 1645 99 %     Weight --      Height --      Head Circumference --      Peak Flow --      Pain Score 08/14/22 1646 0     Pain Loc --      Pain Edu? --      Excl. in [place]? --    No data found.  Updated Vital Signs BP 107/60 (BP Location: Left Arm)   Pulse 75   Temp 97.6 F (36.4 C) (Oral)   Resp 16   SpO2 99%   Breastfeeding No   Visual Acuity Right Eye Distance:   Left Eye Distance:   Bilateral Distance:    Right Eye Near:   Left Eye Near:    Bilateral Near:     Physical Exam Constitutional:      General: She is not in acute distress.    Appearance: Normal appearance. She is not toxic-appearing or diaphoretic.  HENT:     Head: Normocephalic and atraumatic.  Eyes:     Extraocular Movements: Extraocular movements intact.     Conjunctiva/sclera: Conjunctivae normal.  Cardiovascular:     Rate and Rhythm: Normal rate and regular rhythm.     Pulses: Normal pulses.     Heart sounds: Normal heart sounds.  Pulmonary:     Effort: Pulmonary effort is normal. No respiratory distress.     Breath sounds: Normal breath sounds.  Abdominal:     General: Bowel sounds are normal. There is no distension.     Palpations: Abdomen is soft.     Tenderness: There is no abdominal tenderness.   Genitourinary:    Comments: Deferred with shared decision making. Self swab performed.  Neurological:  General: No focal deficit present.     Mental Status: She is alert and oriented to person, place, and time. Mental status is at baseline.  Psychiatric:        Mood and Affect: Mood normal.        Behavior: Behavior normal.        Thought Content: Thought content normal.        Judgment: Judgment normal.      UC Treatments / Results  Labs (all labs ordered are listed, but only abnormal results are displayed) Labs Reviewed  POCT URINALYSIS DIP (MANUAL ENTRY) - Abnormal; Notable for the following components:      Result Value   Spec Grav, UA >=1.030 (*)    All other components within normal limits  POCT URINE PREGNANCY  CERVICOVAGINAL ANCILLARY ONLY    EKG   Radiology No results found.  Procedures Procedures (including critical care time)  Medications Ordered in UC Medications - No data to display  Initial Impression / Assessment and Plan / UC Course  I have reviewed the triage vital signs and the nursing notes.  Pertinent labs & imaging results that were available during my care of the patient were reviewed by me and considered in my medical decision making (see chart for details).     UA was unremarkable.  Cervicovaginal swab pending.  Will opt to treat with doxycycline given patient's confirmed chlamydia exposure.  Urine pregnancy negative.  Patient advised to refrain from sexual activity until test results and treatment are complete.  Discussed return precautions.  Patient verbalized understanding and was agreeable with plan. Final Clinical Impressions(s) / UC Diagnoses   Final diagnoses:  Exposure to chlamydia  Dysuria  Screening examination for venereal disease     Discharge Instructions      I am prophylactically treating you with doxycycline antibiotic for chlamydia exposure.  Please take this medication with food as to prevent nausea.  Urine  pregnancy was negative.  Please refrain from sexual activity until test results and treatment are complete.  Your vaginal swab is pending.  We will call if it is positive.     ED Prescriptions     Medication Sig Dispense Auth. Provider   doxycycline (VIBRAMYCIN) 100 MG capsule Take 1 capsule (100 mg total) by mouth 2 (two) times daily. 20 capsule Teodora Medici, [place]      PDMP not reviewed this encounter.   Teodora Medici, [place] 08/14/22 1718

## 2022-08-15 LAB — CERVICOVAGINAL ANCILLARY ONLY
Bacterial Vaginitis (gardnerella): NEGATIVE
Candida Glabrata: NEGATIVE
Candida Vaginitis: NEGATIVE
Chlamydia: NEGATIVE
Comment: NEGATIVE
Comment: NEGATIVE
Comment: NEGATIVE
Comment: NEGATIVE
Comment: NEGATIVE
Comment: NORMAL
Neisseria Gonorrhea: NEGATIVE
Trichomonas: NEGATIVE

## 2022-09-04 DIAGNOSIS — Z419 Encounter for procedure for purposes other than remedying health state, unspecified: Secondary | ICD-10-CM | POA: Diagnosis not present

## 2022-10-04 DIAGNOSIS — Z419 Encounter for procedure for purposes other than remedying health state, unspecified: Secondary | ICD-10-CM | POA: Diagnosis not present

## 2022-10-17 DIAGNOSIS — B3731 Acute candidiasis of vulva and vagina: Secondary | ICD-10-CM | POA: Diagnosis not present

## 2022-10-17 DIAGNOSIS — N7689 Other specified inflammation of vagina and vulva: Secondary | ICD-10-CM | POA: Diagnosis not present

## 2022-11-04 DIAGNOSIS — Z419 Encounter for procedure for purposes other than remedying health state, unspecified: Secondary | ICD-10-CM | POA: Diagnosis not present

## 2022-12-02 DIAGNOSIS — R21 Rash and other nonspecific skin eruption: Secondary | ICD-10-CM | POA: Diagnosis not present

## 2022-12-02 DIAGNOSIS — N9489 Other specified conditions associated with female genital organs and menstrual cycle: Secondary | ICD-10-CM | POA: Diagnosis not present

## 2022-12-02 DIAGNOSIS — B354 Tinea corporis: Secondary | ICD-10-CM | POA: Diagnosis not present

## 2022-12-05 DIAGNOSIS — Z419 Encounter for procedure for purposes other than remedying health state, unspecified: Secondary | ICD-10-CM | POA: Diagnosis not present

## 2023-01-03 DIAGNOSIS — Z419 Encounter for procedure for purposes other than remedying health state, unspecified: Secondary | ICD-10-CM | POA: Diagnosis not present

## 2023-01-07 ENCOUNTER — Ambulatory Visit
Admission: RE | Admit: 2023-01-07 | Discharge: 2023-01-07 | Disposition: A | Discharge status: Home / Self Care | Payer: Medicaid Other | Source: Ambulatory Visit | Attending: Physician Assistant | Admitting: Physician Assistant

## 2023-01-07 VITALS — BP 123/81 | HR 78 | Temp 97.6°F | Resp 18

## 2023-01-07 DIAGNOSIS — Z20822 Contact with and (suspected) exposure to covid-19: Secondary | ICD-10-CM | POA: Diagnosis not present

## 2023-01-07 DIAGNOSIS — S161XXA Strain of muscle, fascia and tendon at neck level, initial encounter: Secondary | ICD-10-CM | POA: Diagnosis not present

## 2023-01-07 DIAGNOSIS — R519 Headache, unspecified: Secondary | ICD-10-CM | POA: Diagnosis present

## 2023-01-07 DIAGNOSIS — Z1152 Encounter for screening for COVID-19: Secondary | ICD-10-CM

## 2023-01-07 DIAGNOSIS — Z8616 Personal history of COVID-19: Secondary | ICD-10-CM | POA: Insufficient documentation

## 2023-01-07 DIAGNOSIS — S46912A Strain of unspecified muscle, fascia and tendon at shoulder and upper arm level, left arm, initial encounter: Secondary | ICD-10-CM | POA: Diagnosis not present

## 2023-01-07 DIAGNOSIS — J069 Acute upper respiratory infection, unspecified: Secondary | ICD-10-CM | POA: Diagnosis not present

## 2023-01-07 MED ORDER — DM-GUAIFENESIN ER 30-600 MG PO TB12: 1.0000 | ORAL_TABLET | Freq: Two times a day (BID) | ORAL | 0 refills | Status: DC

## 2023-01-07 MED ORDER — FLUTICASONE PROPIONATE 50 MCG/ACT NA SUSP: 2.0000 | Freq: Every day | NASAL | 0 refills | Status: DC

## 2023-01-07 MED ORDER — IBUPROFEN 600 MG PO TABS: 600.0000 mg | ORAL_TABLET | Freq: Four times a day (QID) | ORAL | 0 refills | Status: AC | PRN

## 2023-01-07 MED ORDER — TIZANIDINE HCL 4 MG PO TABS: 4.0000 mg | ORAL_TABLET | Freq: Four times a day (QID) | ORAL | 0 refills | Status: DC | PRN
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
## 2023-01-07 NOTE — Discharge Instructions (Signed)
Test to be completed in 48 hours.  If you do not get a call from this office in 48 hours that indicates the test is negative.  Log onto MyChart.  Test results with the post in 48 hours.  Advised to take the ibuprofen 600 mg every 6 hours with food on a regular basis to help decrease pain and discomfort. Advised take Zanaflex 4 mg every 8 hours to help prevent muscle spasm.  Advised take Mucinex DM every 12 hours to control cough and congestion. Advised to use the Flonase nasal spray, 2 sprays each nostril once daily to help decrease upper respiratory and sinus congestion.  Advised follow-up PCP or return to urgent care if symptoms fail to improve.
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
## 2023-01-08 DIAGNOSIS — N760 Acute vaginitis: Secondary | ICD-10-CM | POA: Diagnosis not present

## 2023-01-08 DIAGNOSIS — Z01419 Encounter for gynecological examination (general) (routine) without abnormal findings: Secondary | ICD-10-CM | POA: Diagnosis not present

## 2023-01-08 LAB — SARS CORONAVIRUS 2 (TAT 6-24 HRS): SARS Coronavirus 2: NEGATIVE

## 2023-01-20 DIAGNOSIS — S6391XA Sprain of unspecified part of right wrist and hand, initial encounter: Secondary | ICD-10-CM | POA: Diagnosis not present

## 2023-01-20 DIAGNOSIS — N760 Acute vaginitis: Secondary | ICD-10-CM | POA: Diagnosis not present

## 2023-01-20 DIAGNOSIS — S6991XA Unspecified injury of right wrist, hand and finger(s), initial encounter: Secondary | ICD-10-CM | POA: Diagnosis not present

## 2023-01-20 DIAGNOSIS — Z881 Allergy status to other antibiotic agents status: Secondary | ICD-10-CM | POA: Diagnosis not present

## 2023-01-20 DIAGNOSIS — B9689 Other specified bacterial agents as the cause of diseases classified elsewhere: Secondary | ICD-10-CM | POA: Diagnosis not present

## 2023-01-20 DIAGNOSIS — M25531 Pain in right wrist: Secondary | ICD-10-CM | POA: Diagnosis not present

## 2023-01-20 DIAGNOSIS — S63501A Unspecified sprain of right wrist, initial encounter: Secondary | ICD-10-CM | POA: Diagnosis not present

## 2023-01-20 DIAGNOSIS — F17210 Nicotine dependence, cigarettes, uncomplicated: Secondary | ICD-10-CM | POA: Diagnosis not present

## 2023-02-03 DIAGNOSIS — Z419 Encounter for procedure for purposes other than remedying health state, unspecified: Secondary | ICD-10-CM | POA: Diagnosis not present

## 2023-03-05 DIAGNOSIS — Z419 Encounter for procedure for purposes other than remedying health state, unspecified: Secondary | ICD-10-CM | POA: Diagnosis not present

## 2023-04-05 DIAGNOSIS — Z419 Encounter for procedure for purposes other than remedying health state, unspecified: Secondary | ICD-10-CM | POA: Diagnosis not present

## 2023-04-18 DIAGNOSIS — L293 Anogenital pruritus, unspecified: Secondary | ICD-10-CM | POA: Diagnosis not present

## 2023-04-18 DIAGNOSIS — B3731 Acute candidiasis of vulva and vagina: Secondary | ICD-10-CM | POA: Diagnosis not present

## 2023-05-05 DIAGNOSIS — Z419 Encounter for procedure for purposes other than remedying health state, unspecified: Secondary | ICD-10-CM | POA: Diagnosis not present

## 2023-06-05 DIAGNOSIS — Z419 Encounter for procedure for purposes other than remedying health state, unspecified: Secondary | ICD-10-CM | POA: Diagnosis not present

## 2023-08-13 ENCOUNTER — Ambulatory Visit
Admission: EM | Admit: 2023-08-13 | Discharge: 2023-08-13 | Disposition: A | Discharge status: Home / Self Care | Payer: Medicaid Other | Attending: Internal Medicine | Admitting: Internal Medicine

## 2023-08-13 DIAGNOSIS — R112 Nausea with vomiting, unspecified: Secondary | ICD-10-CM

## 2023-08-13 DIAGNOSIS — R197 Diarrhea, unspecified: Secondary | ICD-10-CM | POA: Diagnosis not present

## 2023-08-13 MED ORDER — ONDANSETRON 4 MG PO TBDP: 4.0000 mg | ORAL_TABLET | Freq: Three times a day (TID) | ORAL | 0 refills | Status: AC | PRN

## 2023-08-13 NOTE — ED Triage Notes (Signed)
Patient here today with c/o diarrhea, nausea, vomiting, and body aches since Sunday night after eating [place]. She tried Tums and Pepcid, Imodium, and Pepto Bismol with little relief.

## 2023-08-13 NOTE — ED Provider Notes (Signed)
[name]    CSN: [id number] Arrival date & time: 08/13/23  0804      History   Chief Complaint Chief Complaint  Patient presents with   Diarrhea    HPI Theresa Shepherd is a 37 y.o. female.   Patient presents with nausea, vomiting, diarrhea that started about 3 days ago.  Denies blood in stool or emesis.  Reports pain is described as abdominal cramping that is intermittent.  She has taken Tums, Pepcid, Imodium, Pepto-Bismol for symptoms.  She has been able to tolerate food and fluids.  Reports symptoms have been intermittent and seemed to improve yesterday, but then returned last night.  She reports that it started after she ate some chicken at [employer].  Denies any obvious known sick contacts.   Diarrhea   Past Medical History:  Diagnosis Date   Asthma    as child   Eczema    Gestational diabetes    Infection    UTI   Kidney stones    UTI (urinary tract infection)     Patient Active Problem List   Diagnosis Date Noted   Encounter for maternal care for low transverse scar from repeat cesarean delivery 02/06/2021   S/P repeat low transverse C-section 02/06/2021   History of cesarean delivery x 1--2012, FTP 02/04/2021   History of COVID-19--12/2020 02/04/2021   History of urinary calculi--2021 02/04/2021   Gestational diabetes mellitus (GDM), antepartum 12/11/2020   GBS bacteriuria 09/19/2020    Past Surgical History:  Procedure Laterality Date   CESAREAN SECTION     CESAREAN SECTION N/A 02/06/2021   Procedure: CESAREAN SECTION;  Surgeon: Jaymes Graff, MD;  Location: [facility];  Service: Obstetrics;  Laterality: N/A;   CYSTOSCOPY WITH RETROGRADE PYELOGRAM, URETEROSCOPY AND STENT PLACEMENT Left 08/02/2021   Procedure: CYSTOSCOPY WITH RETROGRADE PYELOGRAM, URETEROSCOPY AND STENT PLACEMENT;  Surgeon: Malen Gauze, MD;  Location: [facility];  Service: Urology;  Laterality: Left;   INDUCED ABORTION      OB History     Gravida  4   Para   2   Term  2   Preterm      AB  1   Living  2      SAB      IAB  1   Ectopic      Multiple  0   Live Births  2            Home Medications    Prior to Admission medications   Medication Sig Start Date End Date Taking? Authorizing Provider  ondansetron (ZOFRAN-ODT) 4 MG disintegrating tablet Take 1 tablet (4 mg total) by mouth every 8 (eight) hours as needed for nausea or vomiting. 08/13/23  Yes Jordyan Hardiman, Rolly Salter E, FNP  ibuprofen (ADVIL) 600 MG tablet Take 1 tablet (600 mg total) by mouth every 6 (six) hours as needed. 01/07/23   Ellsworth Lennox, PA-C  triamcinolone (KENALOG) 0.025 % ointment Apply 1 application. topically 2 (two) times daily. 03/29/22   Elson Areas, PA-C    Family History Family History  Problem Relation Age of Onset   Diabetes Maternal Grandmother    Heart disease Maternal Grandmother    Asthma Other    Hypertension Other    Diabetes Other    Heart disease Paternal Grandfather    Diabetes Mother    Hypertension Mother    COPD Mother    Hypertension Father    Stomach cancer Paternal Grandmother  Social History Social History   Tobacco Use   Smoking status: Never   Smokeless tobacco: Never  Vaping Use   Vaping status: Never Used  Substance Use Topics   Alcohol use: Not Currently    Alcohol/week: 3.0 standard drinks of alcohol    Types: 3 Standard drinks or equivalent per week    Comment: 2-3x/month   Drug use: Not Currently    Types: Marijuana     Allergies   Augmentin [amoxicillin-pot clavulanate]   Review of Systems Review of Systems Per HPI  Physical Exam Triage Vital Signs ED Triage Vitals  Encounter Vitals Group     BP 08/13/23 0819 122/81     Systolic BP Percentile --      Diastolic BP Percentile --      Pulse Rate 08/13/23 0819 79     Resp 08/13/23 0819 16     Temp 08/13/23 0819 97.9 F (36.6 C)     Temp Source 08/13/23 0819 Oral     SpO2 08/13/23 0819 96 %     Weight 08/13/23 0819 160 lb (72.6 kg)      Height 08/13/23 0819 5\' 3"  (1.6 m)     Head Circumference --      Peak Flow --      Pain Score 08/13/23 0817 7     Pain Loc --      Pain Education --      Exclude from Growth Chart --    No data found.  Updated Vital Signs BP 122/81 (BP Location: Left Arm)   Pulse 79   Temp 97.9 F (36.6 C) (Oral)   Resp 16   Ht 5\' 3"  (1.6 m)   Wt 160 lb (72.6 kg)   LMP  (LMP Unknown)   SpO2 96%   BMI 28.34 kg/m   Visual Acuity Right Eye Distance:   Left Eye Distance:   Bilateral Distance:    Right Eye Near:   Left Eye Near:    Bilateral Near:     Physical Exam Constitutional:      General: She is not in acute distress.    Appearance: Normal appearance. She is not toxic-appearing or diaphoretic.  HENT:     Head: Normocephalic and atraumatic.     Mouth/Throat:     Mouth: Mucous membranes are moist.     Pharynx: No posterior oropharyngeal erythema.  Eyes:     Extraocular Movements: Extraocular movements intact.     Conjunctiva/sclera: Conjunctivae normal.  Cardiovascular:     Rate and Rhythm: Normal rate and regular rhythm.     Pulses: Normal pulses.     Heart sounds: Normal heart sounds.  Pulmonary:     Effort: Pulmonary effort is normal. No respiratory distress.     Breath sounds: Normal breath sounds.  Abdominal:     General: Bowel sounds are normal. There is no distension.     Palpations: Abdomen is soft.     Tenderness: There is no abdominal tenderness.  Neurological:     General: No focal deficit present.     Mental Status: She is alert and oriented to person, place, and time. Mental status is at baseline.  Psychiatric:        Mood and Affect: Mood normal.        Behavior: Behavior normal.        Thought Content: Thought content normal.        Judgment: Judgment normal.      UC Treatments / Results  Labs (all labs ordered are listed, but only abnormal results are displayed) Labs Reviewed - No data to display  EKG   Radiology No results  found.  Procedures Procedures (including critical care time)  Medications Ordered in UC Medications - No data to display  Initial Impression / Assessment and Plan / UC Course  I have reviewed the triage vital signs and the nursing notes.  Pertinent labs & imaging results that were available during my care of the patient were reviewed by me and considered in my medical decision making (see chart for details).     Differential diagnoses include food related illness versus viral stomach virus.  There are no signs of acute abdomen or dehydration on exam that would warrant imaging or emergent evaluation.  Patient prescribed ondansetron to take as needed as she reports that she does not take any daily medication so that should be safe.  Also advised adequate fluid hydration and bland diet.  Patient encouraged to go straight to the emergency department if symptoms persist or worsen.  Patient verbalized understanding and was agreeable with plan. Final Clinical Impressions(s) / UC Diagnoses   Final diagnoses:  Nausea vomiting and diarrhea     Discharge Instructions      Suspect that you have a stomach virus versus it is related to something that you ate.  Please ensure adequate fluid hydration and bland diet as we discussed.  I have prescribed you nausea medication to take as needed.  Follow-up with emergency department if symptoms persist or worsen.    ED Prescriptions     Medication Sig Dispense Auth. Provider   ondansetron (ZOFRAN-ODT) 4 MG disintegrating tablet Take 1 tablet (4 mg total) by mouth every 8 (eight) hours as needed for nausea or vomiting. 20 tablet [place], Acie Fredrickson, [place]      PDMP not reviewed this encounter.   Gustavus Bryant, [place] 08/13/23 [phone]

## 2023-08-13 NOTE — Discharge Instructions (Signed)
Suspect that you have a stomach virus versus it is related to something that you ate.  Please ensure adequate fluid hydration and bland diet as we discussed.  I have prescribed you nausea medication to take as needed.  Follow-up with emergency department if symptoms persist or worsen.

## 2024-01-16 ENCOUNTER — Ambulatory Visit: Payer: Medicaid Other | Admitting: Urology

## 2024-02-16 ENCOUNTER — Ambulatory Visit: Admitting: Urology

## 2024-03-15 ENCOUNTER — Other Ambulatory Visit: Payer: Self-pay

## 2024-03-15 ENCOUNTER — Emergency Department (HOSPITAL_COMMUNITY)

## 2024-03-15 ENCOUNTER — Emergency Department (HOSPITAL_COMMUNITY)
Admission: EM | Admit: 2024-03-15 | Discharge: 2024-03-15 | Disposition: A | Discharge status: Home / Self Care | Attending: Emergency Medicine | Admitting: Emergency Medicine

## 2024-03-15 ENCOUNTER — Encounter (HOSPITAL_COMMUNITY): Payer: Self-pay

## 2024-03-15 DIAGNOSIS — Z87442 Personal history of urinary calculi: Secondary | ICD-10-CM | POA: Insufficient documentation

## 2024-03-15 DIAGNOSIS — R3589 Other polyuria: Secondary | ICD-10-CM | POA: Diagnosis not present

## 2024-03-15 DIAGNOSIS — R109 Unspecified abdominal pain: Secondary | ICD-10-CM | POA: Insufficient documentation

## 2024-03-15 DIAGNOSIS — N83202 Unspecified ovarian cyst, left side: Secondary | ICD-10-CM

## 2024-03-15 LAB — BASIC METABOLIC PANEL WITH GFR
Anion gap: 10 (ref 5–15)
BUN: 8 mg/dL (ref 6–20)
CO2: 22 mmol/L (ref 22–32)
Calcium: 8.9 mg/dL (ref 8.9–10.3)
Chloride: 108 mmol/L (ref 98–111)
Creatinine, Ser: 0.84 mg/dL (ref 0.44–1.00)
GFR, Estimated: >60 mL/min (ref >60)
Glucose, Bld: 127 mg/dL — ABNORMAL HIGH (ref 70–99)
Potassium: 3.6 mmol/L (ref 3.5–5.1)
Sodium: 140 mmol/L (ref 135–145)

## 2024-03-15 LAB — CBC
HCT: 40.6 % (ref 36.0–46.0)
Hemoglobin: 13.6 g/dL (ref 12.0–15.0)
MCH: 30.8 pg (ref 26.0–34.0)
MCHC: 33.5 g/dL (ref 30.0–36.0)
MCV: 92.1 fL (ref 80.0–100.0)
Platelets: 237 10*3/uL (ref 150–400)
RBC: 4.41 MIL/uL (ref 3.87–5.11)
RDW: 11.9 % (ref 11.5–15.5)
WBC: 8.2 10*3/uL (ref 4.0–10.5)
nRBC: 0 % (ref 0.0–0.2)

## 2024-03-15 LAB — URINALYSIS, ROUTINE W REFLEX MICROSCOPIC
Bilirubin Urine: NEGATIVE
Glucose, UA: NEGATIVE mg/dL
Hgb urine dipstick: NEGATIVE
Ketones, ur: NEGATIVE mg/dL
Leukocytes,Ua: NEGATIVE
Nitrite: NEGATIVE
Protein, ur: NEGATIVE mg/dL
Specific Gravity, Urine: 1.012 (ref 1.005–1.030)
pH: 5 (ref 5.0–8.0)

## 2024-03-15 LAB — HCG, SERUM, QUALITATIVE: Preg, Serum: NEGATIVE

## 2024-03-15 MED ORDER — ONDANSETRON 4 MG PO TBDP
4.0000 mg | ORAL_TABLET | Freq: Once | ORAL | Status: AC
  Administered 2024-03-15: 4 mg via ORAL
  Filled 2024-03-15: qty 1

## 2024-03-15 MED ORDER — OXYCODONE HCL 5 MG PO TABS
5.0000 mg | ORAL_TABLET | ORAL | Status: AC
  Administered 2024-03-15: 5 mg via ORAL
  Filled 2024-03-15: qty 1

## 2024-03-15 MED ORDER — KETOROLAC TROMETHAMINE 30 MG/ML IJ SOLN
15.0000 mg | Freq: Once | INTRAMUSCULAR | Status: AC
  Administered 2024-03-15: 15 mg via INTRAVENOUS
  Filled 2024-03-15: qty 1

## 2024-03-15 MED ORDER — HYDROCODONE-ACETAMINOPHEN 5-325 MG PO TABS: 1.0000 | ORAL_TABLET | ORAL | 0 refills | Status: AC | PRN

## 2024-03-15 NOTE — ED Provider Notes (Signed)
  Physical Exam  BP 115/72 (BP Location: Right Arm)   Pulse (!) 56   Temp (!) 97.4 F (36.3 C) (Temporal)   Resp 20   Ht 5\' 3"  (1.6 m)   Wt 73 kg   SpO2 100%   BMI 28.52 kg/m   Physical Exam  Procedures  Procedures  ED Course / MDM   Clinical Course as of 03/16/24 0051  Mon Mar 15, 2024  1557 Assumed care from Dr Liam Redhead. 38 yo F with hx of kidney stones who presented with L flank pain. Awaiting CT scan at this time.  [RP]  1741 Patient reevaluated.  CT scan shows left ovarian cyst.  Reports that she does not have any vaginal discharge, fevers, or concerns for STIs at this time.  Is saying that she still having significant left lower quadrant pain.  Ultrasound ordered to rule out torsion. [RP]    Clinical Course User Index [RP] Ninetta Basket, MD   Medical Decision Making Amount and/or Complexity of Data Reviewed Labs: ordered. Radiology: ordered.  Risk Prescription drug management.   CT scan without signs of torsion.  Feel that her symptoms are likely due to her ovarian cyst.  Sent home with a work note and brief prescription for pain medication.  However follow-up with OB/GYN as an outpatient.  Return precautions discussed prior to discharge.      Ninetta Basket, MD 03/16/24 [phone]

## 2024-03-15 NOTE — Discharge Instructions (Signed)
 You were seen for left-sided flank pain and ovarian cyst in the emergency department.   At home, please take Tylenol  and ibuprofen  for your pain.    Check your MyChart online for the results of any tests that had not resulted by the time you left the emergency department.   Follow-up with your OB/GYN in in 2-3 days regarding your visit.    Return immediately to the emergency department if you experience any of the following: Worsening pain, fevers, vomiting, or any other concerning symptoms.    Thank you for visiting our Emergency Department. It was a pleasure taking care of you today.

## 2024-03-15 NOTE — ED Provider Notes (Signed)
 [place] EMERGENCY DEPARTMENT AT [facility] Provider Note   CSN: [id number] Arrival date & time: 03/15/24  1215     History  Chief Complaint  Patient presents with   Flank Pain    Theresa Shepherd is a 38 y.o. female.  HPI Patient presents with left flank pain.  Onset was yesterday, since that time she has had pain in left flank rating towards left groin.  She has a history of kidney stones.  She has some polyuria, but no dysuria, no fever.  She did vomit yesterday. No relief with OTC medication.    Home Medications Prior to Admission medications   Medication Sig Start Date End Date Taking? Authorizing Provider  ibuprofen  (ADVIL ) 600 MG tablet Take 1 tablet (600 mg total) by mouth every 6 (six) hours as needed. 01/07/23   Gretel Leaven, PA-C  ondansetron  (ZOFRAN -ODT) 4 MG disintegrating tablet Take 1 tablet (4 mg total) by mouth every 8 (eight) hours as needed for nausea or vomiting. 08/13/23   Dodson Freestone, FNP  triamcinolone  (KENALOG ) 0.025 % ointment Apply 1 application. topically 2 (two) times daily. 03/29/22   Sofia, Leslie K, PA-C      Allergies    Augmentin [amoxicillin -pot clavulanate]    Review of Systems   Review of Systems  Physical Exam Updated Vital Signs BP (!) 135/57 (BP Location: Right Arm)   Pulse (!) 57   Temp (!) 97.5 F (36.4 C)   Resp 16   Ht 5\' 3"  (1.6 m)   Wt 73 kg   SpO2 99%   BMI 28.52 kg/m  Physical Exam Vitals and nursing note reviewed.  Constitutional:      General: She is not in acute distress.    Appearance: She is well-developed.  HENT:     Head: Normocephalic and atraumatic.  Eyes:     Conjunctiva/sclera: Conjunctivae normal.  Cardiovascular:     Rate and Rhythm: Normal rate and regular rhythm.  Pulmonary:     Effort: Pulmonary effort is normal. No respiratory distress.     Breath sounds: Normal breath sounds. No stridor.  Abdominal:     General: There is no distension.     Tenderness: There is no abdominal  tenderness. There is left CVA tenderness. There is no guarding.  Skin:    General: Skin is warm and dry.  Neurological:     Mental Status: She is alert and oriented to person, place, and time.     Cranial Nerves: No cranial nerve deficit.  Psychiatric:        Mood and Affect: Mood normal.     ED Results / Procedures / Treatments   Labs (all labs ordered are listed, but only abnormal results are displayed) Labs Reviewed  BASIC METABOLIC PANEL WITH GFR - Abnormal; Notable for the following components:      Result Value   Glucose, Bld 127 (*)    All other components within normal limits  URINALYSIS, ROUTINE W REFLEX MICROSCOPIC  HCG, SERUM, QUALITATIVE  CBC    EKG None  Radiology No results found.  Procedures Procedures    Medications Ordered in ED Medications  ketorolac  (TORADOL ) 30 MG/ML injection 15 mg (has no administration in time range)    ED Course/ Medical Decision Making/ A&P Clinical Course as of 03/15/24 1559  Mon Mar 15, 2024  1557 Assumed care from Dr Liam Redhead. 38 yo F with hx of kidney stones who presented with L flank pain. Awaiting CT scan at this  time.  [RP]    Clinical Course User Index [RP] Ninetta Basket, MD                                 Medical Decision Making Adult female history of kidney stones presents with left flank pain, left inguinal pain.  He is awake, alert, afebrile.  She does have some nausea, vomiting, and GI as a possibility, though with her history kidney pathology more likely. Patient received Toradol , CT labs.   Amount and/or Complexity of Data Reviewed External Data Reviewed: notes. Labs: ordered. Decision-making details documented in ED Course. Radiology: ordered.  Risk Prescription drug management. Decision regarding hospitalization.  Patient's initial labs were reassuring urinalysis reassuring, CT scan pending on signout.  Final Clinical Impression(s) / ED Diagnoses Final diagnoses:  Left flank pain     Rx / DC Orders ED Discharge Orders     None         Dorenda Gandy, MD 03/15/24 1600

## 2024-03-15 NOTE — ED Triage Notes (Signed)
 Pt reports that she has some soreness in her lower Lt back in the Lt flank area that will have some shooting pains down towards her Lt groin with some shooting pains down her Lt thigh. Also reports that she has Hx of Kidney stones & she has not been peeing as much lately as well. Plus a spot behind her neck was tender yesterday so she is not sure if she is having nerve pain or kidney stones again. Denies any pain with urination or emptying her bladder, had some n/v yesterday.

## 2024-03-15 NOTE — ED Notes (Signed)
 Patient transported to [place]

## 2024-03-16 ENCOUNTER — Ambulatory Visit: Admitting: Urology

## 2024-03-23 ENCOUNTER — Ambulatory Visit: Admitting: Urology

## 2024-09-07 ENCOUNTER — Ambulatory Visit
Admission: RE | Admit: 2024-09-07 | Discharge: 2024-09-07 | Disposition: A | Discharge status: Home / Self Care | Payer: Self-pay | Source: Ambulatory Visit | Attending: Family Medicine | Admitting: Family Medicine

## 2024-09-07 VITALS — BP 122/80 | HR 73 | Temp 97.6°F | Resp 16

## 2024-09-07 DIAGNOSIS — R300 Dysuria: Secondary | ICD-10-CM | POA: Insufficient documentation

## 2024-09-07 DIAGNOSIS — Z113 Encounter for screening for infections with a predominantly sexual mode of transmission: Secondary | ICD-10-CM | POA: Insufficient documentation

## 2024-09-07 DIAGNOSIS — R319 Hematuria, unspecified: Secondary | ICD-10-CM | POA: Insufficient documentation

## 2024-09-07 DIAGNOSIS — Z87442 Personal history of urinary calculi: Secondary | ICD-10-CM | POA: Insufficient documentation

## 2024-09-07 LAB — POCT URINE DIPSTICK
Bilirubin, UA: NEGATIVE
Glucose, UA: NEGATIVE mg/dL
Leukocytes, UA: NEGATIVE
Nitrite, UA: NEGATIVE
POC PROTEIN,UA: NEGATIVE
Spec Grav, UA: 1.025 (ref 1.010–1.025)
Urobilinogen, UA: 0.2 U/dL
pH, UA: 6 (ref 5.0–8.0)

## 2024-09-07 LAB — POCT URINE PREGNANCY: Preg Test, Ur: NEGATIVE

## 2024-09-07 NOTE — Discharge Instructions (Addendum)
 Make sure you hydrate very well with plain water and a quantity of 80 ounces of water a day.  Please limit drinks that are considered urinary irritants such as fruit juices, soda, sweet tea, coffee, artifical sweetened drinks, energy drinks, alcohol.  These can worsen your urinary and genital symptoms but also be the source of them.  I will let you know about your urine culture and vaginal swab results through MyChart to see if we need to prescribe or change your antibiotics based off of those results.

## 2024-09-07 NOTE — ED Triage Notes (Signed)
 Pt c/o dysuria x 2 weeks-did an online visit treated for BV/completed abx-states she wants to be checked STDs-states she vaginal d/c is baseline-NAD-steady gait

## 2024-09-07 NOTE — ED Provider Notes (Signed)
 [facility]  Note:  This document was prepared using Conservation officer, historic buildings and may include unintentional dictation errors.  MRN: [id number] DOB: August 16, 1986  Subjective:   Theresa Shepherd is a 38 y.o. female presenting for 2 week history of slight dysuria. Denies fever, n/v, abdominal pain, pelvic pain, rashes, urinary frequency, hematuria, vaginal discharge.  Recently, completed treatment with BV and yeast vaginitis in the past week from an online video visit. Just had LMP.  Had white vaginal discharge at the beginning which is normal for her.  Currently does not have any.  Does not drink much water . Drinks sweet tea and lemonade daily.   No current facility-administered medications for this encounter.  Current Outpatient Medications:    HYDROcodone -acetaminophen  (NORCO/VICODIN) 5-325 MG tablet, Take 1 tablet by mouth every 4 (four) hours as needed., Disp: 10 tablet, Rfl: 0   ibuprofen  (ADVIL ) 600 MG tablet, Take 1 tablet (600 mg total) by mouth every 6 (six) hours as needed., Disp: 30 tablet, Rfl: 0   ondansetron  (ZOFRAN -ODT) 4 MG disintegrating tablet, Take 1 tablet (4 mg total) by mouth every 8 (eight) hours as needed for nausea or vomiting., Disp: 20 tablet, Rfl: 0   triamcinolone  (KENALOG ) 0.025 % ointment, Apply 1 application. topically 2 (two) times daily., Disp: 454 g, Rfl: 0   Allergies  Allergen Reactions   Augmentin [Amoxicillin -Pot Clavulanate] Hives    Past Medical History:  Diagnosis Date   Asthma    as child   Eczema    Gestational diabetes    Infection    UTI   Kidney stones    UTI (urinary tract infection)      Past Surgical History:  Procedure Laterality Date   CESAREAN SECTION     CESAREAN SECTION N/A 02/06/2021   Procedure: CESAREAN SECTION;  Surgeon: Armond Cape, MD;  Location: [facility];  Service: Obstetrics;  Laterality: N/A;   CYSTOSCOPY WITH RETROGRADE PYELOGRAM, URETEROSCOPY AND STENT PLACEMENT Left 08/02/2021    Procedure: CYSTOSCOPY WITH RETROGRADE PYELOGRAM, URETEROSCOPY AND STENT PLACEMENT;  Surgeon: Sherrilee Belvie CROME, MD;  Location: [facility];  Service: Urology;  Laterality: Left;   INDUCED ABORTION      Family History  Problem Relation Age of Onset   Diabetes Maternal Grandmother    Heart disease Maternal Grandmother    Asthma Other    Hypertension Other    Diabetes Other    Heart disease Paternal Grandfather    Diabetes Mother    Hypertension Mother    COPD Mother    Hypertension Father    Stomach cancer Paternal Grandmother     Social History   Tobacco Use   Smoking status: Never   Smokeless tobacco: Never  Vaping Use   Vaping status: Never Used  Substance Use Topics   Alcohol use: Not Currently   Drug use: Yes    Types: Marijuana    ROS   Objective:   Vitals: BP 122/80 (BP Location: Right Arm)   Pulse 73   Temp 97.6 F (36.4 C) (Oral)   Resp 16   LMP 08/29/2024   SpO2 96%   Physical Exam Constitutional:      General: She is not in acute distress.    Appearance: Normal appearance. She is well-developed. She is not ill-appearing, toxic-appearing or diaphoretic.  HENT:     Head: Normocephalic and atraumatic.     Nose: Nose normal.     Mouth/Throat:     Mouth: Mucous membranes are moist.  Eyes:     General: No scleral icterus.       Right eye: No discharge.        Left eye: No discharge.     Extraocular Movements: Extraocular movements intact.     Conjunctiva/sclera: Conjunctivae normal.  Cardiovascular:     Rate and Rhythm: Normal rate.  Pulmonary:     Effort: Pulmonary effort is normal.  Abdominal:     General: Bowel sounds are normal. There is no distension.     Palpations: Abdomen is soft. There is no mass.     Tenderness: There is no abdominal tenderness. There is no right CVA tenderness, left CVA tenderness, guarding or rebound.  Skin:    General: Skin is warm and dry.  Neurological:     General: No focal deficit present.     Mental Status:  She is alert and oriented to person, place, and time.  Psychiatric:        Mood and Affect: Mood normal.        Behavior: Behavior normal.        Thought Content: Thought content normal.        Judgment: Judgment normal.    Results for orders placed or performed during the hospital encounter of 09/07/24 (from the past 24 hours)  POCT URINE DIPSTICK     Status: Abnormal   Collection Time: 09/07/24  4:17 PM  Result Value Ref Range   Color, UA yellow yellow   Clarity, UA clear clear   Glucose, UA negative negative mg/dL   Bilirubin, UA negative negative   Ketones, POC UA trace (5) (A) negative mg/dL   Spec Grav, UA 8.974 8.989 - 1.025   Blood, UA moderate (A) negative   pH, UA 6.0 5.0 - 8.0   POC PROTEIN,UA negative negative, trace   Urobilinogen, UA 0.2 0.2 or 1.0 E.U./dL   Nitrite, UA Negative Negative   Leukocytes, UA Negative Negative  POCT urine pregnancy     Status: None   Collection Time: 09/07/24  4:17 PM  Result Value Ref Range   Preg Test, Ur Negative Negative    Assessment and Plan :   PDMP not reviewed this encounter.  1. Dysuria   2. Screen for STD (sexually transmitted disease)   3. Hematuria, unspecified type   4. History of renal stone    Emphasized the need to hydrate much more consistently and avoid urinary irritants such as drinking sweet tea and lemonade daily.  As patient does not have any active vaginal discharge suggestive of BV or yeast we will defer that testing so as to avoid antibiotic overuse and treatment resistance.  STI check pending.  Counseled patient on potential for adverse effects with medications prescribed/recommended today, ER and return-to-clinic precautions discussed, patient verbalized understanding.    Christopher Savannah, [place] 09/07/24 1635

## 2024-09-08 LAB — CERVICOVAGINAL ANCILLARY ONLY
Chlamydia: NEGATIVE
Comment: NEGATIVE
Comment: NEGATIVE
Comment: NORMAL
Neisseria Gonorrhea: NEGATIVE
Trichomonas: NEGATIVE

## 2024-09-09 LAB — URINE CULTURE

## 2024-09-10 ENCOUNTER — Ambulatory Visit (HOSPITAL_COMMUNITY): Payer: Self-pay
